# Patient Record
Sex: Female | Born: 1965 | Race: Black or African American | Hispanic: No | Marital: Married | Smoking: Never smoker
Health system: Southern US, Community
[De-identification: ages and names within clinical notes are randomized; demographics above are authoritative.]

## PROBLEM LIST (undated history)

## (undated) DIAGNOSIS — Z8669 Personal history of other diseases of the nervous system and sense organs: Secondary | ICD-10-CM

## (undated) DIAGNOSIS — G510 Bell's palsy: Secondary | ICD-10-CM

## (undated) DIAGNOSIS — G43019 Migraine without aura, intractable, without status migrainosus: Secondary | ICD-10-CM

## (undated) HISTORY — DX: Personal history of other diseases of the nervous system and sense organs: Z86.69

---
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
## 2000-06-28 ENCOUNTER — Encounter: Payer: Self-pay | Admitting: Infectious Diseases

## 2000-06-28 ENCOUNTER — Ambulatory Visit (HOSPITAL_COMMUNITY): Admission: RE | Admit: 2000-06-28 | Discharge: 2000-06-28 | Payer: Self-pay | Admitting: Infectious Diseases

## 2000-07-08 ENCOUNTER — Other Ambulatory Visit: Admission: RE | Admit: 2000-07-08 | Discharge: 2000-07-08 | Payer: Self-pay | Admitting: Family Medicine

## 2001-01-10 ENCOUNTER — Inpatient Hospital Stay (HOSPITAL_COMMUNITY): Admission: AD | Admit: 2001-01-10 | Discharge: 2001-01-10 | Payer: Self-pay | Admitting: Obstetrics and Gynecology

## 2001-06-20 ENCOUNTER — Ambulatory Visit (HOSPITAL_COMMUNITY): Admission: RE | Admit: 2001-06-20 | Discharge: 2001-06-20 | Payer: Self-pay | Admitting: Obstetrics and Gynecology

## 2001-06-20 ENCOUNTER — Encounter: Payer: Self-pay | Admitting: Obstetrics and Gynecology

## 2002-02-14 ENCOUNTER — Ambulatory Visit (HOSPITAL_COMMUNITY): Admission: RE | Admit: 2002-02-14 | Discharge: 2002-02-14 | Payer: Self-pay | Admitting: *Deleted

## 2002-05-20 ENCOUNTER — Other Ambulatory Visit: Admission: RE | Admit: 2002-05-20 | Discharge: 2002-05-20 | Payer: Self-pay | Admitting: Obstetrics and Gynecology

## 2002-07-01 ENCOUNTER — Ambulatory Visit (HOSPITAL_COMMUNITY): Admission: RE | Admit: 2002-07-01 | Discharge: 2002-07-01 | Payer: Self-pay | Admitting: Obstetrics and Gynecology

## 2002-07-14 ENCOUNTER — Encounter: Payer: Self-pay | Admitting: Obstetrics and Gynecology

## 2002-07-14 ENCOUNTER — Ambulatory Visit (HOSPITAL_COMMUNITY): Admission: RE | Admit: 2002-07-14 | Discharge: 2002-07-14 | Payer: Self-pay | Admitting: Obstetrics and Gynecology

## 2002-12-13 ENCOUNTER — Inpatient Hospital Stay (HOSPITAL_COMMUNITY): Admission: AD | Admit: 2002-12-13 | Discharge: 2002-12-17 | Payer: Self-pay | Admitting: Obstetrics and Gynecology

## 2002-12-18 ENCOUNTER — Encounter: Admission: RE | Admit: 2002-12-18 | Discharge: 2003-01-17 | Payer: Self-pay | Admitting: Obstetrics and Gynecology

## 2003-02-17 ENCOUNTER — Encounter: Admission: RE | Admit: 2003-02-17 | Discharge: 2003-03-19 | Payer: Self-pay | Admitting: Obstetrics and Gynecology

## 2003-03-20 ENCOUNTER — Encounter: Admission: RE | Admit: 2003-03-20 | Discharge: 2003-04-19 | Payer: Self-pay | Admitting: Obstetrics and Gynecology

## 2003-05-20 ENCOUNTER — Encounter: Admission: RE | Admit: 2003-05-20 | Discharge: 2003-06-19 | Payer: Self-pay | Admitting: Internal Medicine

## 2003-11-04 ENCOUNTER — Other Ambulatory Visit: Admission: RE | Admit: 2003-11-04 | Discharge: 2003-11-04 | Payer: Self-pay | Admitting: Family Medicine

## 2005-09-04 ENCOUNTER — Ambulatory Visit: Payer: Self-pay | Admitting: Gastroenterology

## 2005-09-13 ENCOUNTER — Other Ambulatory Visit: Admission: RE | Admit: 2005-09-13 | Discharge: 2005-09-13 | Payer: Self-pay | Admitting: Obstetrics and Gynecology

## 2005-09-20 ENCOUNTER — Encounter (INDEPENDENT_AMBULATORY_CARE_PROVIDER_SITE_OTHER): Payer: Self-pay | Admitting: Specialist

## 2005-09-20 ENCOUNTER — Ambulatory Visit: Payer: Self-pay | Admitting: Gastroenterology

## 2006-02-01 ENCOUNTER — Ambulatory Visit (HOSPITAL_COMMUNITY): Admission: RE | Admit: 2006-02-01 | Discharge: 2006-02-01 | Payer: Self-pay | Admitting: Obstetrics and Gynecology

## 2006-10-23 ENCOUNTER — Encounter: Admission: RE | Admit: 2006-10-23 | Discharge: 2006-10-23 | Payer: Self-pay | Admitting: Gastroenterology

## 2006-11-08 ENCOUNTER — Encounter: Admission: RE | Admit: 2006-11-08 | Discharge: 2006-11-08 | Payer: Self-pay | Admitting: Gastroenterology

## 2008-02-18 ENCOUNTER — Ambulatory Visit (HOSPITAL_COMMUNITY): Admission: RE | Admit: 2008-02-18 | Discharge: 2008-02-18 | Payer: Self-pay | Admitting: Gastroenterology

## 2009-05-24 ENCOUNTER — Ambulatory Visit (HOSPITAL_COMMUNITY): Admission: RE | Admit: 2009-05-24 | Discharge: 2009-05-24 | Payer: Self-pay | Admitting: Gastroenterology

## 2010-08-06 ENCOUNTER — Encounter: Payer: Self-pay | Admitting: Gastroenterology

## 2010-09-27 ENCOUNTER — Encounter: Payer: Self-pay | Admitting: Gastroenterology

## 2010-10-03 NOTE — Letter (Signed)
Summary: Colonoscopy Letter  [facility]  [address] [place], [place] [address]   Phone: [phone]  Fax: [phone]      September 27, 2010 MRN: [id number]   [facility] [address] [place], [place]  [address]   Dear Ms. HAYFORD,   According to your medical record, it is time for you to schedule a Colonoscopy. The American Cancer Society recommends this procedure as a method to detect early colon cancer. Patients with a family history of colon cancer, or a personal history of colon polyps or inflammatory bowel disease are at increased risk.  This letter has been generated based on the recommendations made at the time of your procedure. If you feel that in your particular situation this may no longer apply, please contact our office.  Please call our office at [phone] to schedule this appointment or to update your records at your earliest convenience.  Thank you for cooperating with [place] to provide you with the very best care possible.   Sincerely,  Judie Petit T. Russella Dar, M.D.  [facility] [phone]

## 2010-12-01 NOTE — Discharge Summary (Signed)
NAME:  Jennifer Jarvis, Jennifer Jarvis                        ACCOUNT NO.:  [id number]   MEDICAL RECORD NO.:  [id number]                   PATIENT TYPE:  INP   LOCATION:  9105                                 FACILITY:  [facility]   PHYSICIAN:  Huel Cote, M.D.              DATE OF BIRTH:  06-Oct-1965   DATE OF ADMISSION:  12/13/2002  DATE OF DISCHARGE:  12/17/2002                                 DISCHARGE SUMMARY   DISCHARGE DIAGNOSES:  1. Term pregnancy at 40+ weeks delivered.  2. Status post cerclage for possible incompetent cervix removed at 36 weeks.  3. Fibroid uterus.  4. Advanced maternal age.  5. Arrest of dilation.  6. Nonreassuring fetal tracing.  7. Status post low transverse cesarean section.   DISCHARGE MEDICATIONS:  1. Motrin 600 mg p.o. q.6h.  2. Percocet one to two tablets p.o. q.4h. p.r.n.   HISTORY OF PRESENT ILLNESS:  The patient is a 45 year old G3, P0-0-2-0 who  was admitted at 40+ weeks with an estimated day of delivery of Dec 12, 2002  with a complaint of ruptured membranes. She was admitted and having some  spontaneous contractions, therefore, was initially observed, however, was  begun on Pitocin when she failed to have significant cervical change.  Prenatal care had been complicated by history of cerclage placed at  approximately 15 weeks given possible history of incompetent cervix with a  16-week miscarriage in 1999.  This cerclage was removed at 36 weeks without  difficulty and the patient continued to carry until 40 weeks.  Prenatal care  was also complicated by advanced maternal age, however, the patient declined  amniocentesis.  She had a normal ultrasound.   PRENATAL LABORATORY DATA:  B+, antibody negative, sickle cell negative, RPR  nonreactive, rubella immune, hepatitis B surface antigen negative, HIV  negative, GC negative, Chlamydia negative, triple screen declined.  One hour  Glucola was 79, three hour was normal at 94, 179, 146, 126.  Group B strep  was  negative.   PAST MEDICAL HISTORY:  None.   PAST SURGICAL HISTORY:  None.   PAST GYNECOLOGICAL HISTORY:  No abnormal Pap smears.   PAST OBSTETRICAL HISTORY:  In 1999, she had a miscarriage at 16 weeks and in  2002 she had a miscarriage at four weeks.   PHYSICAL EXAMINATION:  VITAL SIGNS:  On admission, the patient was afebrile  with stable vital signs.  ABDOMEN:  Soft.  Fundal height was 39 cm.  Estimated fetal weight was 7-1/2  to 8 pounds.  VAGINAL:  Cervix was 2 cm, 50% effaced and -2 station.   HOSPITAL COURSE:  As stated, she was begun on Pitocin when her cervix did  not change substantially over the next several hours.  She had an IUPC  placed for adequate Pitocin adjustment after her epidural, given continued  slow progress from approximately 7:30 a.m. until 1:30 p.m., the patient  progressed from 3 cm to  5 cm; however, never became completely effaced and  fetal station was not progressing beyond a -1.  There were beginnings of  some late onset variables and a dysfunctional labor pattern.  The Pitocin  was adjusted several times and a fetal scalp electrode placed with  improvement of the strip temporarily, however, by 3:30 p.m. the patient  remained 80% effaced, 5 to 6 cm dilated and -1 station.  The vertex was in  the OP presentation and the fetal heart rate continued to have late onset  variables intermittently when the Pitocin was increased to adequate levels.  Given her lack of progress and the nonreassuring fetal tracing, the patient  and husband were counseled and agreed to proceed with a primary low  transverse cesarean section.  She was delivered of a vigorous female infant,  Apgars 8 and 9.  Weight was 10 pounds 7 ounces.  The uterus was noted to  have a pedunculated fibroid anteriorly.  Tubes and ovaries were normal  bilaterally.  She was then admitted for routine postoperative care and did  very well.   On postoperative day #3, she was tolerating a regular diet,  voiding without  difficulty, and her pain was well controlled on Motrin and Percocet.  Therefore, she was felt stable for discharge home.                                               Huel Cote, M.D.    KR/MEDQ  D:  12/17/2002  T:  12/17/2002  Job:  [id number]

## 2010-12-01 NOTE — Op Note (Signed)
   NAME:  Jennifer Jarvis, Jennifer Jarvis                        ACCOUNT NO.:  [id number]   MEDICAL RECORD NO.:  [id number]                   PATIENT TYPE:  AMB   LOCATION:  SDC                                  FACILITY:  [facility]   PHYSICIAN:  Zenaida Niece, M.D.             DATE OF BIRTH:  27-Apr-1966   DATE OF PROCEDURE:  07/01/2002  DATE OF DISCHARGE:                                 OPERATIVE REPORT   PREOPERATIVE DIAGNOSES:  Intrauterine pregnancy at 16 weeks and probable  incompetent cervix.   POSTOPERATIVE DIAGNOSES:  Intrauterine pregnancy at 16 weeks and probable  incompetent cervix.   PROCEDURE:  [employer] cervical cerclage.   SURGEON:  Zenaida Niece, M.D.   ANESTHESIA:  Spinal.   ESTIMATED BLOOD LOSS:  Less than 50 cc.   FINDINGS:  Preoperative cervix was closed, soft, and approximately 2.5 cm  long and it was unchanged after the suture was placed.   PROCEDURE IN DETAIL:  The patient was taken to the operating room and placed  in a sitting position.  Dr. Harvest Forest instilled spinal anesthesia and she was  placed in the dorsal supine position.  Her legs were then placed in mobile  stirrups as her spinal took effect.  The level of her anesthesia was found  to be adequate and her perineum was prepped and draped in the usual sterile  fashion and her bladder was drained with a red rubber catheter.  A weighted  speculum was inserted into the vagina and the vagina was irrigated with warm  saline.  A ring forceps was used to grasp the cervix anteriorly and it was  moved posteriorly when needed.  A [employer] cervical cerclage was performed  in a counterclockwise fashion with #1 Prolene without complications.  The  suture was tied anteriorly and the string was cut to approximately 2 cm.  The cervix and vagina were then again irrigated with warm saline and no  active bleeding was noted.  The cervix remained closed and a good 2 to 2.5  cm long.  All instruments were then removed from the  vagina.  The patient  was taken down from stirrups.  She was then taken to the recovery room in  stable condition after tolerating the procedure well.  Counts were correct  and she received no antibiotics.                                                Zenaida Niece, M.D.    TDM/MEDQ  D:  07/01/2002  T:  07/01/2002  Job:  [id number]

## 2010-12-01 NOTE — Op Note (Signed)
NAME:  Jennifer Jarvis, Jennifer Jarvis                        ACCOUNT NO.:  [id number]   MEDICAL RECORD NO.:  [id number]                   PATIENT TYPE:  INP   LOCATION:  9105                                 [facility]ILITY:  [facility]   PHYSICIAN:  Huel Cote, M.D.              DATE OF BIRTH:  09-May-1966   DATE OF PROCEDURE:  12/14/2002  DATE OF DISCHARGE:                                 OPERATIVE REPORT   PREOPERATIVE DIAGNOSES:  1. Term pregnancy at 40+ weeks.  2. Nonreassuring fetal tracing.  3. Arrest of dilation.  4. History of cerclage for incompetent cervix.   POSTOPERATIVE DIAGNOSES:  1. Term pregnancy at 40+ weeks.  2. Nonreassuring fetal tracing.  3. Arrest of dilation.  4. History of cerclage for incompetent cervix.   PROCEDURE:  Primary low transverse cesarean section via Pfannenstiel  incision.   SURGEON:  Huel Cote, M.D.   ANESTHESIA:  Epidural.   ESTIMATED BLOOD LOSS:  900 mL.   URINE OUTPUT:  250 mL clear urine.   INTRAVENOUS FLUIDS:  3000 mL LR.   FINDINGS:  The uterus was noted to have a pedunculated fundal fibroid  approximately 5 cm in width but tubes and ovaries were normal bilaterally.  There was vigorous female infant in the vertex presentation.  Apgars were 8  and 9.  Weight was 7 pounds 7 ounces, and placenta appeared essentially  normal except for some calcifications.   DESCRIPTION OF PROCEDURE:  The patient was taken to the operating room where  epidural anesthesia was found to be adequate by Allis clamp test.  She was  then prepped and draped in a normal sterile fashion in the dorsal supine  position with a leftward tilt.  A Pfannenstiel skin incision was then made  about 2 cm above the symphysis pubis and carried through to the underlying  layer of fascia with sharp dissection and Bovie cautery.  The fascia was  then nicked in the midline and the incision was extended laterally with Mayo  scissors.  The inferior aspect of the incision was then grasped  with Kocher  clamps, elevated and dissected off the underlying rectus muscles.  The  superior aspect was also grasped and elevated off the rectus muscles.  The  rectus muscles were separated in the midline.  The peritoneum was identified  and entered bluntly with a hemostat clamp.  At this point, there was some  serous fluid from peritoneal edema noted, and the remainder of the  peritoneal incision was extended both superiorly and inferiorly with careful  attention to avoid both bowel and bladder.  The bladder blade was then  inserted, and the vesicouterine peritoneum identified and the bladder flap  created with the Metzenbaum scissors. The bladder blade was then reinserted  under the flap, and the lower uterine incision was incised in a transverse  fashion with a scalpel.  This incision was extended with bandage scissors at  each  angle.  The infant's head was then delivered atraumatically, and the  remainder of the body delivered without difficulty.  The infant was bulb  suctioned.  The cord was clamped and cut, and the infant handed off to the  awaiting pediatricians. There was no nuchal cord noted, and the placenta  delivered manually with only some mild calcifications noted but no other  pathology evident.  The uterine incision was then grasped at each angle.  There was one small extension in the lower right angle which was also  grasped with a ring forceps.  The uterine incision was then repaired with 1-  0 chromic in a running locked fashion.  The extension was repaired  separately and found to be hemostatic.  An additional suture of 1-0 chromic  was placed in the left angle which was bleeding slightly at the conclusion  of the uterine closure, and this appeared hemostatic at that point.  The  abdomen and pelvis were carefully irrigated and inspected.  The uterus was  moved aside and the fibroid and the fundal surface anteriorly was palpated  which was approximately 5 cm and  pedunculated.  The tubes and ovaries were  normal bilaterally.  The bladder flap was carefully inspected as it had been  slightly adherent and there was no evidence of bladder injury.  One suture  of 3-0 Vicryl was used in a running stitch to close an area of peritoneum  over the bladder surface to provide reinforcement, however, there was no  breech of the lateral wall noted.  The pelvis and abdomen were once again  irrigated, and all found to be hemostatic.  Therefore, the instruments and  sponges were removed from the abdomen, and the rectus muscles were  reapproximated with two interrupted sutures of 1-0 chromic.  The subfascial  plane was inspected.  There was a mild area of bleeding noted anteriorly  which was controlled with Bovie cautery.  The fascia was then closed with 0  Vicryl in a running fashion.  The skin was closed with staples.  Sponge, lap  and needle counts were correct x2, and the patient was taken to the recovery  room in stable condition.                                               Huel Cote, M.D.    KR/MEDQ  D:  12/14/2002  T:  12/14/2002  Job:  [id number]

## 2011-07-23 DIAGNOSIS — J309 Allergic rhinitis, unspecified: Secondary | ICD-10-CM | POA: Insufficient documentation

## 2011-07-23 DIAGNOSIS — G4700 Insomnia, unspecified: Secondary | ICD-10-CM | POA: Insufficient documentation

## 2012-03-24 ENCOUNTER — Other Ambulatory Visit: Payer: Self-pay | Admitting: Family Medicine

## 2012-03-24 DIAGNOSIS — Z1231 Encounter for screening mammogram for malignant neoplasm of breast: Secondary | ICD-10-CM

## 2012-04-02 ENCOUNTER — Encounter: Payer: Self-pay | Admitting: Gastroenterology

## 2012-04-15 ENCOUNTER — Ambulatory Visit
Admission: RE | Admit: 2012-04-15 | Discharge: 2012-04-15 | Disposition: A | Discharge status: Home / Self Care | Payer: 59 | Source: Ambulatory Visit | Attending: Family Medicine | Admitting: Family Medicine

## 2012-04-15 DIAGNOSIS — Z1231 Encounter for screening mammogram for malignant neoplasm of breast: Secondary | ICD-10-CM

## 2014-11-05 ENCOUNTER — Emergency Department (HOSPITAL_COMMUNITY)
Admission: EM | Admit: 2014-11-05 | Discharge: 2014-11-05 | Disposition: A | Discharge status: Home / Self Care | Payer: 59 | Attending: Emergency Medicine | Admitting: Emergency Medicine

## 2014-11-05 ENCOUNTER — Emergency Department (HOSPITAL_COMMUNITY): Payer: 59

## 2014-11-05 ENCOUNTER — Encounter (HOSPITAL_COMMUNITY): Payer: Self-pay | Admitting: Emergency Medicine

## 2014-11-05 DIAGNOSIS — Y92410 Unspecified street and highway as the place of occurrence of the external cause: Secondary | ICD-10-CM | POA: Diagnosis not present

## 2014-11-05 DIAGNOSIS — Y998 Other external cause status: Secondary | ICD-10-CM | POA: Insufficient documentation

## 2014-11-05 DIAGNOSIS — S161XXA Strain of muscle, fascia and tendon at neck level, initial encounter: Secondary | ICD-10-CM | POA: Diagnosis not present

## 2014-11-05 DIAGNOSIS — Y9389 Activity, other specified: Secondary | ICD-10-CM | POA: Diagnosis not present

## 2014-11-05 DIAGNOSIS — S199XXA Unspecified injury of neck, initial encounter: Secondary | ICD-10-CM | POA: Diagnosis present

## 2014-11-05 DIAGNOSIS — Z3202 Encounter for pregnancy test, result negative: Secondary | ICD-10-CM | POA: Insufficient documentation

## 2014-11-05 DIAGNOSIS — S3991XA Unspecified injury of abdomen, initial encounter: Secondary | ICD-10-CM | POA: Diagnosis not present

## 2014-11-05 DIAGNOSIS — Z91040 Latex allergy status: Secondary | ICD-10-CM | POA: Diagnosis not present

## 2014-11-05 LAB — URINALYSIS, ROUTINE W REFLEX MICROSCOPIC
Bilirubin Urine: NEGATIVE
Glucose, UA: NEGATIVE mg/dL
HGB URINE DIPSTICK: NEGATIVE
Ketones, ur: NEGATIVE mg/dL
Leukocytes, UA: NEGATIVE
NITRITE: NEGATIVE
PROTEIN: NEGATIVE mg/dL
Specific Gravity, Urine: 1.005 (ref 1.005–1.030)
UROBILINOGEN UA: 0.2 mg/dL (ref 0.0–1.0)
pH: 5 (ref 5.0–8.0)

## 2014-11-05 LAB — POC URINE PREG, ED: PREG TEST UR: NEGATIVE
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
## 2014-11-05 MED ORDER — CYCLOBENZAPRINE HCL 10 MG PO TABS
10.0000 mg | ORAL_TABLET | Freq: Once | ORAL | Status: DC
  Filled 2014-11-05: qty 1

## 2014-11-05 MED ORDER — IBUPROFEN 800 MG PO TABS
800.0000 mg | ORAL_TABLET | Freq: Once | ORAL | Status: DC
  Filled 2014-11-05: qty 1

## 2014-11-05 MED ORDER — IBUPROFEN 800 MG PO TABS: 800.0000 mg | ORAL_TABLET | Freq: Three times a day (TID) | ORAL | Status: DC

## 2014-11-05 MED ORDER — ORPHENADRINE CITRATE ER 100 MG PO TB12: 100.0000 mg | ORAL_TABLET | Freq: Two times a day (BID) | ORAL | Status: DC
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
## 2014-11-05 NOTE — Discharge Instructions (Signed)
Cervical Sprain °A cervical sprain is an injury in the neck in which the strong, fibrous tissues (ligaments) that connect your neck bones stretch or tear. Cervical sprains can range from mild to severe. Severe cervical sprains can cause the neck vertebrae to be unstable. This can lead to damage of the spinal cord and can result in serious nervous system problems. The amount of time it takes for a cervical sprain to get better depends on the cause and extent of the injury. Most cervical sprains heal in 1 to 3 weeks. °CAUSES  °Severe cervical sprains may be caused by:  °· Contact sport injuries (such as from football, rugby, wrestling, hockey, auto racing, gymnastics, diving, martial arts, or boxing).   °· Motor vehicle collisions.   °· Whiplash injuries. This is an injury from a sudden forward and backward whipping movement of the head and neck.  °· Falls.   °Mild cervical sprains may be caused by:  °· Being in an awkward position, such as while cradling a telephone between your ear and shoulder.   °· Sitting in a chair that does not offer proper support.   °· Working at a poorly designed computer station.   °· Looking up or down for long periods of time.   °SYMPTOMS  °· Pain, soreness, stiffness, or a burning sensation in the front, back, or sides of the neck. This discomfort may develop immediately after the injury or slowly, 24 hours or more after the injury.   °· Pain or tenderness directly in the middle of the back of the neck.   °· Shoulder or upper back pain.   °· Limited ability to move the neck.   °· Headache.   °· Dizziness.   °· Weakness, numbness, or tingling in the hands or arms.   °· Muscle spasms.   °· Difficulty swallowing or chewing.   °· Tenderness and swelling of the neck.   °DIAGNOSIS  °Most of the time your health care provider can diagnose a cervical sprain by taking your history and doing a physical exam. Your health care provider will ask about previous neck injuries and any known neck  problems, such as arthritis in the neck. X-rays may be taken to find out if there are any other problems, such as with the bones of the neck. Other tests, such as a CT scan or MRI, may also be needed.  °TREATMENT  °Treatment depends on the severity of the cervical sprain. Mild sprains can be treated with rest, keeping the neck in place (immobilization), and pain medicines. Severe cervical sprains are immediately immobilized. Further treatment is done to help with pain, muscle spasms, and other symptoms and may include: °· Medicines, such as pain relievers, numbing medicines, or muscle relaxants.   °· Physical therapy. This may involve stretching exercises, strengthening exercises, and posture training. Exercises and improved posture can help stabilize the neck, strengthen muscles, and help stop symptoms from returning.   °HOME CARE INSTRUCTIONS  °· Put ice on the injured area.   °¨ Put ice in a plastic bag.   °¨ Place a towel between your skin and the bag.   °¨ Leave the ice on for 15-20 minutes, 3-4 times a day.   °· If your injury was severe, you may have been given a cervical collar to wear. A cervical collar is a two-piece collar designed to keep your neck from moving while it heals. °¨ Do not remove the collar unless instructed by your health care provider. °¨ If you have long hair, keep it outside of the collar. °¨ Ask your health care provider before making any adjustments to your collar. Minor   adjustments may be required over time to improve comfort and reduce pressure on your chin or on the back of your head. °¨ If you are allowed to remove the collar for cleaning or bathing, follow your health care provider's instructions on how to do so safely. °¨ Keep your collar clean by wiping it with mild soap and water and drying it completely. If the collar you have been given includes removable pads, remove them every 1-2 days and hand wash them with soap and water. Allow them to air dry. They should be completely  dry before you wear them in the collar. °¨ If you are allowed to remove the collar for cleaning and bathing, wash and dry the skin of your neck. Check your skin for irritation or sores. If you see any, tell your health care provider. °¨ Do not drive while wearing the collar.   °· Only take over-the-counter or prescription medicines for pain, discomfort, or fever as directed by your health care provider.   °· Keep all follow-up appointments as directed by your health care provider.   °· Keep all physical therapy appointments as directed by your health care provider.   °· Make any needed adjustments to your workstation to promote good posture.   °· Avoid positions and activities that make your symptoms worse.   °· Warm up and stretch before being active to help prevent problems.   °SEEK MEDICAL CARE IF:  °· Your pain is not controlled with medicine.   °· You are unable to decrease your pain medicine over time as planned.   °· Your activity level is not improving as expected.   °SEEK IMMEDIATE MEDICAL CARE IF:  °· You develop any bleeding. °· You develop stomach upset. °· You have signs of an allergic reaction to your medicine.   °· Your symptoms get worse.   °· You develop new, unexplained symptoms.   °· You have numbness, tingling, weakness, or paralysis in any part of your body.   °MAKE SURE YOU:  °· Understand these instructions. °· Will watch your condition. °· Will get help right away if you are not doing well or get worse. °Document Released: 04/29/2007 Document Revised: 07/07/2013 Document Reviewed: 01/07/2013 °ExitCare® Patient Information ©2015 [employer]. This information is not intended to replace advice given to you by your health care provider. Make sure you discuss any questions you have with your health care provider. ° ° °Emergency Department Resource Guide °1) Find a Doctor and Pay Out of Pocket °Although you won't have to find out who is covered by your insurance plan, it is a good idea to ask  around and get recommendations. You will then need to call the office and see if the doctor you have chosen will accept you as a new patient and what types of options they offer for patients who are self-pay. Some doctors offer discounts or will set up payment plans for their patients who do not have insurance, but you will need to ask so you aren't surprised when you get to your appointment. ° °2) Contact Your Local Health Department °Not all health departments have doctors that can see patients for sick visits, but many do, so it is worth a call to see if yours does. If you don't know where your local health department is, you can check in your phone book. The CDC also has a tool to help you locate your state's health department, and many state websites also have listings of all of their local health departments. ° °3) Find a Walk-in Clinic °If your illness is not   likely to be very severe or complicated, you may want to try a walk in clinic. These are popping up all over the country in pharmacies, drugstores, and shopping centers. They're usually staffed by nurse practitioners or physician assistants that have been trained to treat common illnesses and complaints. They're usually fairly quick and inexpensive. However, if you have serious medical issues or chronic medical problems, these are probably not your best option. ° °No Primary Care Doctor: °- Call [facility] at  832-8000 - they can help you locate a primary care doctor that  accepts your insurance, provides certain services, etc. °- Physician Referral Service- 1-800-533-3463 ° °Chronic Pain Problems: °Organization         Address  Phone   Notes  °[facility]  (336) 297-2271 Patients need to be referred by their primary care doctor.  ° °Medication Assistance: °Organization         Address  Phone   Notes  °[place] [facility] [address] °[place], [place] [address] (336) 641-8030 --Must be a  resident of [place] °-- Must have NO insurance coverage whatsoever (no Medicaid/ Medicare, etc.) °-- The pt. MUST have a primary care doctor that directs their care regularly and follows them in the community °  °[facility]  (866) 331-1348   °[employer]  (888) 892-1162   ° °Agencies that provide inexpensive medical care: °Organization         Address  Phone   Notes  °[facility]  (336) 832-8035   °[facility]    (336) 832-7272   °[facility] [address] °[place], [place] [address] (336) 832-4777   °[facility] [address], °[place] (336) 271-4999   °[facility]    (336) 373-0678   °[facility]    (336) 272-1050   °[facility] ° [address], [place] Phone:  (336) 832-4444, Fax:  (336) 832-4440 Hours of Operation:  9 am - 6 pm, M-F.  Also accepts Medicaid/Medicare and self-pay.  °[facility] ° [address], [place] Phone: (336) 832-3150, Fax: (336) 832-3151. Hours of Operation:  8:30 am - 5:30 pm, M-F.  Also accepts Medicaid and self-pay.  °[facility] [place] [address], [place] Phone: (336) 878-6027   °[facility] [address], [place], [place] (336)723-1848, Ext. 123 Mondays & Thursdays: 7-9 AM.  First 15 patients are seen on a first come, first serve basis. °  ° °Medicaid-accepting [place] Providers: ° °Organization         Address  Phone   Notes  °[facility] [address], [place] (336) 641-2100 Also accepts self-pay patients.  °[facility] [address], [place] ° (336) 856-9996   °[facility] [address], [place] (336) 288-8857   °[facility] [address] [place] [address], [place] (336) 299-7000   °Veita Bland [address], [place]  ° (336) 373-1557 Only accepts  [employer] Access Medicaid patients after they have their name applied to their card.  ° °Self-Pay (no insurance) in [place]: ° °Organization         Address  Phone   Notes  °Sickle Cell Patients, [facility] [address], [place] (336) 832-1970   °[facility] [address], [place] (336) 832-4400   °[facility] [place] ° [address], [place] (336) 992-4800   °[facility]/Dr. Osei-Bonsu ° [address] [place] [address], [place] or [address], [place] (336) 841-8500 Phone number for both [place] and [place] locations is the same.  °[facility] [address], [place] (336) 299-0000   °[facility] [address] [place] [address], [place] or [address] (336) 852-7530 °(336) 878-2260   °[facility] [address], [place] (336) 350-1642, phone; (336) 294-5005, fax Sees patients 1st and 3rd Saturday of every month.  Must not qualify for public or private insurance (i.e. Medicaid, Medicare, [place] Health Choice, Veterans' Benefits) • Household income should be no more than 200% of the poverty level •The clinic cannot treat you if you are pregnant or think you are pregnant • Sexually transmitted diseases are not treated at the clinic.  ° ° °Dental Care: °Organization         Address  Phone  Notes  °[facility] [address], [place] (336) 641-6152 Accepts children up to age 21 who are enrolled in Medicaid or [place] Health Choice; pregnant women with a Medicaid card; and children who have applied for Medicaid or [place] Health Choice, but were declined, whose parents can pay a reduced fee at time of service.  °[facility] [place]  [address], [place] (336) 641-7733 Accepts children up to age 21 who are enrolled in Medicaid or [place] Health Choice; pregnant women  with a Medicaid card; and children who have applied for Medicaid or [place] Health Choice, but were declined, whose parents can pay a reduced fee at time of service.  °[facility] ° [address], [place] (336) 641-4533 Patients are seen by appointment only. Walk-ins are not accepted. [facility] will see patients 18 years of age and older. °Monday - Tuesday (8am-5pm) °Most Wednesdays (8:30-5pm) °$30 per visit, cash only  °[facility] ° [address], [place] (336) 641-4533 Patients are seen by appointment only. Walk-ins are not accepted. [facility] will see patients 18 years of age and older. °One Wednesday Evening (Monthly: Volunteer Based).  $30 per visit, cash only  °[facility]  (919) 537-3737 for adults; Children under age 4, call Graduate Pediatric Dentistry at ([phone]) [phone]. Children aged 4-14, please call (919) [phone] to request a pediatric application. ° Dental services are provided in all areas of dental care including fillings, crowns and bridges, complete and partial dentures, implants, gum treatment, root canals, and extractions. Preventive care is also provided. Treatment is provided to both adults and children. °Patients are selected via a lottery and there is often a waiting list. °  °[facility] [address], °[place] ° (336) 763-8833 [email or web address] °  °[facility] [address], [place], [place] (336)723-1848, Ext. 123 Second and Fourth Thursday of each month, opens at 6:30 AM; Clinic ends at 9 AM.  Patients are seen on a first-come first-served basis, and a limited number are seen during each clinic.  ° °[facility] ° [address], [place], [place] (336) 723-7904   Eligibility Requirements °You must have lived in [place], [place], or [place] counties for at least the last three months. °    You cannot be eligible for state or federal sponsored healthcare  insurance, including Veterans Administration, Medicaid, or Medicare. °  You generally cannot be eligible for healthcare insurance through your employer.  °  How to apply: °Eligibility screenings are held every Tuesday and Wednesday afternoon from 1:00 pm until 4:00 pm. You do not need an appointment for the interview!  °[facility] [address], [place], [place] 336-631-2330   °[facility]  336-342-8273   °[facility]  336-703-3100   °[facility]  336-570-6415   ° °Behavioral Health Resources in the Community: °Intensive Outpatient Programs °Organization         Address  Phone  Notes  °[facility] [address], [place], [place] 336-878-6098   °[facility] [address], [place], [place] 336-832-9800   °[facility] [address], [place], [place] ° 336-882-2125   °[facility] [address],  °[place], [place] 1-800-853-5163 or 336-641-4981   °Substance Abuse Resources °Organization         Address  Phone  Notes  °[facility]  336-882-2125   °[facility]  336-784-9470   °[facility]  336-285-9073   °[facility]  336-845-3988   °Residential & Outpatient Substance Abuse Program  1-800-659-3381   °Psychological Services °Organization         Address  Phone  Notes  °[facility]  336- 832-9600   °[facility]  336- 378-7881   °[facility] [address], [place] 1-800-853-5163 or 336-641-4981   ° °Mobile Crisis Teams °Organization         Address  Phone  Notes  °[facility], Mobile Crisis Care Unit  1-877-626-1772   °Assertive °[facility] ° [address] [place], [place] 336-834-9664   °Sharon DeEsch [address] °[place] 336-554-5454   ° °Self-Help/Support Groups °Organization         Address  Phone             Notes  °[facility] - variety of support groups  336- 373-1402 Call for more information  °Narcotics Anonymous (NA), [facility] [address], °[place]  2 meetings at this location  ° °Residential Treatment Programs °Organization         Address  Phone  Notes  °[facility] [address],    °[place]  1-866-801-8205   °[facility] ° [address], [place], [place] 704-293-8524   °[facility] [address], [place] 336-845-3988 Admissions: 8am-3pm M-F  °[facility] [address]    °[place], [place] 336-841-1104   °[facility] [address], [place], [place] 336-379-7146   °[facility] [address]  °[place], [place] 336-285-9073   °[facility] - Intensive Outpatient [address], , [place] 336-852-3033   °[facility] [address]  °[place], [place] 1-877-615-2722 or 336-784-9470   °[facility] [address] [place],  336-227-7417 Accepts Medicaid  °[facility] [address]  °  1-800-659-3381 Substance Abuse/Addiction Treatment  ° °[place] Behavioral Health Resources °Organization         Address  Phone  Notes  °[facility]  (888) 581-9988   °Julie Brannon, PhD [address] [place], [place]   (336) 349-5553 or (336) 951-0000   °[facility]   [address] °[place], [place] (336) 349-4454   °[facility] [address], [place], [place] (336) 342-8316 Insurance/Medicaid/sponsorship through [facility]  °[facility] [address]                                    [place], [place] (336) 342-8316 Therapy/tele-psych/case  °[facility] [address]  ° [place], [place] (336) 349-2233    °Dr. Arfeen  (336) 349-4544   °[facility] [place]  [employer] [place] [facility] 1) [address],   °2) [address], [place] °3)  [address], [place] (336) 349-3220 °(336) 342-7768 ° °(336) 342-8140   °[place] [facility] (336) 342-1394 or (336) 342-3537 (After Hours)    ° ° ° °

## 2014-11-05 NOTE — ED Notes (Signed)
Patient transported to X-ray 
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
## 2016-02-06 DIAGNOSIS — R7303 Prediabetes: Secondary | ICD-10-CM | POA: Diagnosis not present

## 2016-02-06 DIAGNOSIS — R202 Paresthesia of skin: Secondary | ICD-10-CM | POA: Insufficient documentation

## 2016-02-06 DIAGNOSIS — R200 Anesthesia of skin: Secondary | ICD-10-CM | POA: Diagnosis not present

## 2016-02-06 DIAGNOSIS — M542 Cervicalgia: Secondary | ICD-10-CM | POA: Diagnosis not present

## 2016-02-06 DIAGNOSIS — M549 Dorsalgia, unspecified: Secondary | ICD-10-CM | POA: Diagnosis not present

## 2016-02-06 DIAGNOSIS — D649 Anemia, unspecified: Secondary | ICD-10-CM | POA: Diagnosis not present

## 2016-02-07 DIAGNOSIS — M546 Pain in thoracic spine: Secondary | ICD-10-CM | POA: Diagnosis not present

## 2016-02-07 DIAGNOSIS — M545 Low back pain: Secondary | ICD-10-CM | POA: Diagnosis not present

## 2016-02-07 DIAGNOSIS — M50322 Other cervical disc degeneration at C5-C6 level: Secondary | ICD-10-CM | POA: Diagnosis not present

## 2016-03-05 DIAGNOSIS — M545 Low back pain: Secondary | ICD-10-CM | POA: Diagnosis not present

## 2016-03-05 DIAGNOSIS — M542 Cervicalgia: Secondary | ICD-10-CM | POA: Diagnosis not present

## 2016-03-12 DIAGNOSIS — R202 Paresthesia of skin: Secondary | ICD-10-CM | POA: Diagnosis not present

## 2016-03-12 DIAGNOSIS — M542 Cervicalgia: Secondary | ICD-10-CM | POA: Diagnosis not present

## 2016-03-12 DIAGNOSIS — D649 Anemia, unspecified: Secondary | ICD-10-CM | POA: Diagnosis not present

## 2016-03-12 DIAGNOSIS — Z0000 Encounter for general adult medical examination without abnormal findings: Secondary | ICD-10-CM | POA: Diagnosis not present

## 2016-04-17 ENCOUNTER — Encounter: Payer: Self-pay | Admitting: Neurology

## 2016-04-17 ENCOUNTER — Ambulatory Visit (INDEPENDENT_AMBULATORY_CARE_PROVIDER_SITE_OTHER): Payer: 59 | Admitting: Neurology

## 2016-04-17 VITALS — BP 117/82 | HR 76 | Ht 63.0 in | Wt 200.0 lb

## 2016-04-17 DIAGNOSIS — G894 Chronic pain syndrome: Secondary | ICD-10-CM | POA: Diagnosis not present

## 2016-04-17 DIAGNOSIS — R202 Paresthesia of skin: Secondary | ICD-10-CM

## 2016-04-17 NOTE — Patient Instructions (Signed)
   We will check blood work today and get EMG and NCV evaluation on the nerves and muscles.

## 2016-04-17 NOTE — Progress Notes (Signed)
Reason for visit: Chronic pain  Referring physician: Dr. Sherrill RaringBriscoe  Jennifer Jarvis is a 50 y.o. female  History of present illness:  Jennifer Jarvis is a 50 year old right-handed black female with a history of chronic low back pain and neck discomfort dating back 5-6 years. Over the last 18 months, the discomfort has gradually worsened over time also paralleled by increasing problems with insomnia and chronic fatigue. The patient has decreased cognitive functioning secondary to the lack of sleep. The patient works as a Engineer, civil (consulting)nurse, she reports that the pain may worsen throughout the working day. The patient has a lot of pain in the neck, shoulders bilaterally, with pain going up the back of the head into the occipital area. She reports some neck stiffness and occasional muscle spasms. She also has chronic low back pain and pain down the left leg all the way to the foot. She reports paresthesias in the hands and down into the left leg. The patient feels that the left arm and left leg are slightly weak. The patient denies any balance issues, she does have some occasional stress incontinence of the bladder, she denies any issues controlling the bowels. She may take ibuprofen or Tylenol for the discomfort, she recently was placed on Mobic. She has undergone some physical therapy, she tries to do some stretching exercises off and on. She denies any systemic symptoms such as weight loss, night sweats, fevers or chills. She has been noted in the past have mildly elevated CK enzyme levels. She is sent to this office for an evaluation. A vitamin B12 level was unremarkable.  History reviewed. No pertinent past medical history.  Past Surgical History:  Procedure Laterality Date  . CESAREAN SECTION      History reviewed. No pertinent family history.  Social history:  reports that she has never smoked. She has never used smokeless tobacco. She reports that she does not drink alcohol or use drugs.  Medications:    Prior to Admission medications   Medication Sig Start Date End Date Taking? Authorizing Provider  docusate sodium (COLACE) 100 MG capsule Take 200 mg by mouth 2 (two) times daily as needed for mild constipation or moderate constipation.    Yes Historical Provider, MD  ibuprofen (ADVIL,MOTRIN) 200 MG tablet Take 400 mg by mouth every 6 (six) hours as needed for headache, mild pain or moderate pain.   Yes Historical Provider, MD  meloxicam (MOBIC) 15 MG tablet Take by mouth. 04/11/16 04/11/17 Yes Historical Provider, MD  triamcinolone (NASACORT) 55 MCG/ACT AERO nasal inhaler Place 2 sprays into the nose daily as needed.   Yes Historical Provider, MD  zolpidem (AMBIEN) 10 MG tablet Take 5 mg by mouth at bedtime as needed for sleep.  05/04/14  Yes Historical Provider, MD  acetaminophen (TYLENOL) 500 MG tablet Take 1,000 mg by mouth every 6 (six) hours as needed for mild pain, moderate pain or headache.    Historical Provider, MD      Allergies  Allergen Reactions  . Latex Rash  . Eggs Or Egg-Derived Products Hives    ROS:  Out of a complete 14 system review of symptoms, the patient complains only of the following symptoms, and all other reviewed systems are negative.  Blurred vision Achy muscles Headache, numbness, dizziness Insomnia, sleepiness, shift work  Blood pressure 117/82, pulse 76, height 5\' 3"  (1.6 m), weight 200 lb (90.7 kg).  Physical Exam  General: The patient is alert and cooperative at the time of the examination.  Eyes: Pupils are equal, round, and reactive to light. Discs are flat bilaterally.  Neck: The neck is supple, no carotid bruits are noted.  Respiratory: The respiratory examination is clear.  Cardiovascular: The cardiovascular examination reveals a regular rate and rhythm, no obvious murmurs or rubs are noted.  Neuromuscular: The patient lacks about 30 of full lateral rotation of the cervical spine bilaterally. The patient has full range of motion of the  low back.  Skin: Extremities are without significant edema.  Neurologic Exam  Mental status: The patient is alert and oriented x 3 at the time of the examination. The patient has apparent normal recent and remote memory, with an apparently normal attention span and concentration ability.  Cranial nerves: Facial symmetry is present. There is good sensation of the face to pinprick and soft touch bilaterally. The strength of the facial muscles and the muscles to head turning and shoulder shrug are normal bilaterally. Speech is well enunciated, no aphasia or dysarthria is noted. Extraocular movements are full. Visual fields are full. The tongue is midline, and the patient has symmetric elevation of the soft palate. No obvious hearing deficits are noted.  Motor: The motor testing reveals 5 over 5 strength of all 4 extremities. Good symmetric motor tone is noted throughout.  Sensory: Sensory testing is intact to pinprick, soft touch, vibration sensation, and position sense on all 4 extremities. No evidence of extinction is noted.  Coordination: Cerebellar testing reveals good finger-nose-finger and heel-to-shin bilaterally.  Gait and station: Gait is normal. Tandem gait is normal. Romberg is negative. No drift is seen.  Reflexes: Deep tendon reflexes are symmetric and normal bilaterally. Toes are downgoing bilaterally.   Assessment/Plan:  1. Neuromuscular discomfort  2. Paresthesias, both hands, left leg  The patient may have a fibromyalgia syndrome. The patient will be sent for further blood work today, nerve conduction studies will be done on both arms and the left leg. EMG will be done on the left leg. She will follow-up for the above study. We may consider medications for fibromyalgia if the workup is unremarkable.  Marlan Palau MD 04/17/2016 9:52 AM  [facility] [address] [place], [place] [address]  Phone [phone] Fax [phone]

## 2016-04-18 ENCOUNTER — Telehealth: Payer: Self-pay | Admitting: Neurology

## 2016-04-18 LAB — ANGIOTENSIN CONVERTING ENZYME: Angio Convert Enzyme: 23 U/L (ref 14–82)

## 2016-04-18 LAB — CK: Total CK: 185 U/L — ABNORMAL HIGH (ref 24–173)

## 2016-04-18 LAB — ANA W/REFLEX: ANA: NEGATIVE

## 2016-04-18 LAB — B. BURGDORFI ANTIBODIES: Lyme IgG/IgM Ab: <0.91 {ISR} (ref 0.00–0.90)

## 2016-04-18 LAB — RHEUMATOID FACTOR

## 2016-04-18 LAB — SEDIMENTATION RATE: SED RATE: 15 mm/h (ref 0–32)

## 2016-04-18 NOTE — Telephone Encounter (Signed)
I called the patient. The blood work was OK except for a minimal elevation of the CK level, could be normal for the patient. EMG pending.

## 2016-05-08 ENCOUNTER — Telehealth: Payer: Self-pay | Admitting: Neurology

## 2016-05-08 NOTE — Telephone Encounter (Signed)
error 

## 2016-05-29 ENCOUNTER — Ambulatory Visit (INDEPENDENT_AMBULATORY_CARE_PROVIDER_SITE_OTHER): Payer: 59 | Admitting: Neurology

## 2016-05-29 ENCOUNTER — Encounter: Payer: Self-pay | Admitting: Neurology

## 2016-05-29 ENCOUNTER — Ambulatory Visit (INDEPENDENT_AMBULATORY_CARE_PROVIDER_SITE_OTHER): Payer: Self-pay | Admitting: Neurology

## 2016-05-29 DIAGNOSIS — G894 Chronic pain syndrome: Secondary | ICD-10-CM

## 2016-05-29 DIAGNOSIS — R202 Paresthesia of skin: Secondary | ICD-10-CM

## 2016-05-29 NOTE — Procedures (Signed)
     HISTORY:  Jennifer CarwinSophia Jarvis is a 50 year old patient with a history of neck, shoulder, back, and hip pain. The patient reports some discomfort down the left arm into the thumb and index finger. The patient currently has severe neck stiffness and left arm pain. She is being evaluated for the neuromuscular discomfort.  NERVE CONDUCTION STUDIES:  Nerve conduction studies were performed on the left upper extremity. The distal motor latencies and motor amplitudes for the median and ulnar nerves were within normal limits. The F wave latencies and nerve conduction velocities for these nerves were also normal. The sensory latencies for the median and ulnar nerves were normal.  Nerve conduction studies were performed on the left lower extremity. The distal motor latencies and motor amplitudes for the peroneal and posterior tibial nerves were within normal limits. The nerve conduction velocities for these nerves were also normal. The H reflex latency was normal. The sensory latency for the peroneal nerve was within normal limits.   EMG STUDIES:  EMG study was performed on the left upper extremity:  The first dorsal interosseous muscle reveals 2 to 4 K units with full recruitment. No fibrillations or positive waves were noted. The abductor pollicis brevis muscle reveals 2 to 4 K units with full recruitment. No fibrillations or positive waves were noted. The extensor indicis proprius muscle reveals 1 to 3 K units with full recruitment. No fibrillations or positive waves were noted. The pronator teres muscle reveals 2 to 3 K units with full recruitment. No fibrillations or positive waves were noted. The biceps muscle reveals 1 to 2 K units with full recruitment. No fibrillations or positive waves were noted. The triceps muscle reveals 2 to 4 K units with full recruitment. No fibrillations or positive waves were noted. The anterior deltoid muscle reveals 2 to 3 K units with full recruitment. No  fibrillations or positive waves were noted. The cervical paraspinal muscles were tested at 2 levels. No abnormalities of insertional activity were seen at either level tested. There was good relaxation.   IMPRESSION:  Nerve conduction studies done on the left upper and left lower extremities were within normal limits. There is no evidence of a peripheral neuropathy. EMG of the left upper extremity is unremarkable, without evidence of an overlying cervical radiculopathy.  Marlan Palau. Keith Saxton Chain MD 05/29/2016 4:44 PM  [facility] [address] [place], [place] [id number]  Phone [phone] Fax [phone]

## 2016-05-29 NOTE — Progress Notes (Signed)
The patient comes in for EMG and nerve conduction study today.  The study of the left arm and left leg with nerve conductions were normal, EMG of the left arm was normal.  I have recommended going on Cymbalta, and getting into physical therapy. The patient does not wish to consider this option at this time.  The patient will contact me if she desires any further treatment, we may consider MRI of the cervical spine at some point.  The patient will contact our office if she desires any further treatment or workup.

## 2016-05-29 NOTE — Progress Notes (Signed)
Please refer to EMG and nerve conduction study evaluation. 

## 2019-02-13 DIAGNOSIS — K5900 Constipation, unspecified: Secondary | ICD-10-CM | POA: Insufficient documentation

## 2019-02-13 DIAGNOSIS — M542 Cervicalgia: Secondary | ICD-10-CM | POA: Insufficient documentation

## 2019-02-13 DIAGNOSIS — G8929 Other chronic pain: Secondary | ICD-10-CM | POA: Insufficient documentation

## 2019-04-27 ENCOUNTER — Emergency Department (HOSPITAL_COMMUNITY): Payer: BC Managed Care – PPO

## 2019-04-27 ENCOUNTER — Other Ambulatory Visit: Payer: Self-pay

## 2019-04-27 ENCOUNTER — Emergency Department (HOSPITAL_COMMUNITY)
Admission: EM | Admit: 2019-04-27 | Discharge: 2019-04-27 | Disposition: A | Discharge status: Home / Self Care | Payer: BC Managed Care – PPO | Attending: Emergency Medicine | Admitting: Emergency Medicine

## 2019-04-27 DIAGNOSIS — R2990 Unspecified symptoms and signs involving the nervous system: Secondary | ICD-10-CM | POA: Diagnosis not present

## 2019-04-27 DIAGNOSIS — R29818 Other symptoms and signs involving the nervous system: Secondary | ICD-10-CM | POA: Diagnosis not present

## 2019-04-27 DIAGNOSIS — R29810 Facial weakness: Secondary | ICD-10-CM | POA: Diagnosis present

## 2019-04-27 DIAGNOSIS — Z20828 Contact with and (suspected) exposure to other viral communicable diseases: Secondary | ICD-10-CM | POA: Insufficient documentation

## 2019-04-27 DIAGNOSIS — R531 Weakness: Secondary | ICD-10-CM

## 2019-04-27 DIAGNOSIS — Z91040 Latex allergy status: Secondary | ICD-10-CM | POA: Insufficient documentation

## 2019-04-27 LAB — COMPREHENSIVE METABOLIC PANEL
ALT: 15 U/L (ref 0–44)
AST: 20 U/L (ref 15–41)
Albumin: 3.7 g/dL (ref 3.5–5.0)
Alkaline Phosphatase: 53 U/L (ref 38–126)
Anion gap: 11 (ref 5–15)
BUN: 9 mg/dL (ref 6–20)
CO2: 21 mmol/L — ABNORMAL LOW (ref 22–32)
Calcium: 9.1 mg/dL (ref 8.9–10.3)
Chloride: 105 mmol/L (ref 98–111)
Creatinine, Ser: 0.82 mg/dL (ref 0.44–1.00)
GFR calc Af Amer: >60 mL/min (ref >60)
GFR calc non Af Amer: >60 mL/min (ref >60)
Glucose, Bld: 140 mg/dL — ABNORMAL HIGH (ref 70–99)
Potassium: 3.5 mmol/L (ref 3.5–5.1)
Sodium: 137 mmol/L (ref 135–145)
Total Bilirubin: 0.5 mg/dL (ref 0.3–1.2)
Total Protein: 7.4 g/dL (ref 6.5–8.1)

## 2019-04-27 LAB — CBC
HCT: 39.2 % (ref 36.0–46.0)
Hemoglobin: 13.1 g/dL (ref 12.0–15.0)
MCH: 28.8 pg (ref 26.0–34.0)
MCHC: 33.4 g/dL (ref 30.0–36.0)
MCV: 86.2 fL (ref 80.0–100.0)
Platelets: 346 10*3/uL (ref 150–400)
RBC: 4.55 MIL/uL (ref 3.87–5.11)
RDW: 14.7 % (ref 11.5–15.5)
WBC: 4.8 10*3/uL (ref 4.0–10.5)
nRBC: 0 % (ref 0.0–0.2)

## 2019-04-27 LAB — URINALYSIS, ROUTINE W REFLEX MICROSCOPIC
Bilirubin Urine: NEGATIVE
Glucose, UA: NEGATIVE mg/dL
Hgb urine dipstick: NEGATIVE
Ketones, ur: NEGATIVE mg/dL
Leukocytes,Ua: NEGATIVE
Nitrite: NEGATIVE
Protein, ur: NEGATIVE mg/dL
Specific Gravity, Urine: 1.009 (ref 1.005–1.030)
pH: 5 (ref 5.0–8.0)

## 2019-04-27 LAB — RAPID URINE DRUG SCREEN, HOSP PERFORMED
Amphetamines: NOT DETECTED
Barbiturates: NOT DETECTED
Benzodiazepines: NOT DETECTED
Cocaine: NOT DETECTED
Opiates: NOT DETECTED
Tetrahydrocannabinol: NOT DETECTED

## 2019-04-27 LAB — DIFFERENTIAL
Abs Immature Granulocytes: 0.01 10*3/uL (ref 0.00–0.07)
Basophils Absolute: 0.1 10*3/uL (ref 0.0–0.1)
Basophils Relative: 1 %
Eosinophils Absolute: 0.1 10*3/uL (ref 0.0–0.5)
Eosinophils Relative: 3 %
Immature Granulocytes: 0 %
Lymphocytes Relative: 40 %
Lymphs Abs: 2 10*3/uL (ref 0.7–4.0)
Monocytes Absolute: 0.3 10*3/uL (ref 0.1–1.0)
Monocytes Relative: 7 %
Neutro Abs: 2.4 10*3/uL (ref 1.7–7.7)
Neutrophils Relative %: 49 %

## 2019-04-27 LAB — SARS CORONAVIRUS 2 BY RT PCR (HOSPITAL ORDER, PERFORMED IN ~~LOC~~ HOSPITAL LAB): SARS Coronavirus 2: NEGATIVE

## 2019-04-27 LAB — PROTIME-INR
INR: 1 (ref 0.8–1.2)
Prothrombin Time: 12.8 seconds (ref 11.4–15.2)

## 2019-04-27 LAB — ETHANOL: Alcohol, Ethyl (B): <10 mg/dL (ref <10)

## 2019-04-27 LAB — I-STAT BETA HCG BLOOD, ED (MC, WL, AP ONLY): I-stat hCG, quantitative: <5 m[IU]/mL (ref <5)

## 2019-04-27 LAB — APTT: aPTT: 29 seconds (ref 24–36)

## 2019-04-27 MED ORDER — KETOROLAC TROMETHAMINE 15 MG/ML IJ SOLN
15.0000 mg | Freq: Once | INTRAMUSCULAR | Status: AC
  Administered 2019-04-27: 15 mg via INTRAVENOUS
  Filled 2019-04-27: qty 1

## 2019-04-27 MED ORDER — DIPHENHYDRAMINE HCL 50 MG/ML IJ SOLN
50.0000 mg | Freq: Once | INTRAMUSCULAR | Status: AC
  Administered 2019-04-27: 50 mg via INTRAVENOUS

## 2019-04-27 MED ORDER — PROCHLORPERAZINE EDISYLATE 10 MG/2ML IJ SOLN
5.0000 mg | Freq: Once | INTRAMUSCULAR | Status: AC
  Administered 2019-04-27: 5 mg via INTRAVENOUS
  Filled 2019-04-27: qty 2

## 2019-04-27 MED ORDER — METOCLOPRAMIDE HCL 5 MG/ML IJ SOLN
10.0000 mg | Freq: Once | INTRAMUSCULAR | Status: AC
  Administered 2019-04-27: 14:00:00 10 mg via INTRAVENOUS
  Filled 2019-04-27: qty 2

## 2019-04-27 MED ORDER — SODIUM CHLORIDE 0.9% FLUSH
3.0000 mL | Freq: Once | INTRAVENOUS | Status: AC
Start: 2019-04-27 — End: 2019-04-27
  Administered 2019-04-27: 3 mL via INTRAVENOUS

## 2019-04-27 MED ORDER — DEXAMETHASONE SODIUM PHOSPHATE 10 MG/ML IJ SOLN
10.0000 mg | Freq: Once | INTRAMUSCULAR | Status: AC
  Administered 2019-04-27: 10 mg via INTRAVENOUS
  Filled 2019-04-27: qty 1

## 2019-04-27 MED ORDER — GADOBUTROL 1 MMOL/ML IV SOLN: 10.0000 mL | Freq: Once | INTRAVENOUS | Status: DC | PRN

## 2019-04-27 MED ORDER — DIPHENHYDRAMINE HCL 50 MG/ML IJ SOLN
INTRAMUSCULAR | Status: AC
  Filled 2019-04-27: qty 1

## 2019-04-27 NOTE — ED Notes (Signed)
Pt d/c home with husband. AVS given opportunity for questions provided and answered with teachback

## 2019-04-27 NOTE — ED Provider Notes (Signed)
[facility] EM[facility]ENC[facility] Provider Note   CSN: [id number] Arrival date & time: 04/27/19  1123  An emergency department physician performed an initial assessment on this suspected stroke patient at 1240.  History   Chief Complaint Chief Complaint  Patient presents with  . Stroke Symptoms    HPI Suttyn HAYFORD Delford FieldWRIGHT is a 53 y.o. female.     HPI  53 year old female presents today complaining of onset of right facial droop that began at 9:51 AM this morning.  She states over the past several days she has felt some twitching in her face.  Today, she felt like there was a drooping and abdominal area noted in the facial droop.  She has also noted some right hand weakness.  She denies any difficulty walking or speaking.  She has noted some headaches for the past couple weeks in December.  She denies any fever, chills, sinus congestion or pressure.  No past medical history on file.  Patient Active Problem List   Diagnosis Date Noted  . Paresthesia 04/17/2016  . Chronic pain syndrome 04/17/2016    Past Surgical History:  Procedure Laterality Date  . CESAREAN SECTION       OB History   No obstetric history on file.      Home Medications    Prior to Admission medications   Medication Sig Start Date End Date Taking? Authorizing Provider  acetaminophen (TYLENOL) 500 MG tablet Take 1,000 mg by mouth every 6 (six) hours as needed for mild pain, moderate pain or headache.   Yes [provider]  docusate sodium (COLACE) 100 MG capsule Take 200 mg by mouth 2 (two) times daily as needed for mild constipation or moderate constipation.    Yes [provider]  ibuprofen (ADVIL,MOTRIN) 200 MG tablet Take 400 mg by mouth every 6 (six) hours as needed for headache, mild pain or moderate pain.   Yes [provider]  triamcinolone (NASACORT) 55 MCG/ACT AERO nasal inhaler Place 2 sprays into the nose daily as needed.   Yes [provider]  zolpidem (AMBIEN) 10 MG tablet Take 5 mg by mouth at bedtime as needed for sleep.  05/04/14  Yes [provider]    Family History Family History  Problem Relation Age of Onset  . Healthy Mother   . Healthy Father   . Healthy Sister   . Healthy Brother   . Healthy Sister     Social History Social History   Tobacco Use  . Smoking status: Never Smoker  . Smokeless tobacco: Never Used  Substance Use Topics  . Alcohol use: No  . Drug use: No     Allergies   Latex and Eggs or egg-derived products   Review of Systems Review of Systems  All other systems reviewed and are negative.    Physical Exam Updated Vital Signs BP 120/76   Pulse 76   Temp 97.9 F (36.6 C) (Oral)   Resp 20   Ht 1.6 m (5\' 3" )   Wt 99.8 kg   LMP 03/28/2019   SpO2 100%   BMI 38.97 kg/m   Physical Exam Vitals signs and nursing note reviewed.  Constitutional:      Appearance: Normal appearance.  HENT:     Head: Normocephalic.     Right Ear: External ear normal.     Left Ear: External ear normal.     Nose: Nose normal.     Mouth/Throat:     Mouth: Mucous membranes  are moist.  Eyes:     Extraocular Movements: Extraocular movements intact.     Pupils: Pupils are equal, round, and reactive to light.  Neck:     Musculoskeletal: Normal range of motion.  Cardiovascular:     Rate and Rhythm: Normal rate and regular rhythm.  Pulmonary:     Effort: Pulmonary effort is normal.     Breath sounds: Normal breath sounds.  Abdominal:     General: Bowel sounds are normal. There is no distension.     Palpations: Abdomen is soft. There is no mass.     Tenderness: There is no abdominal tenderness.  Musculoskeletal: Normal range of motion.  Skin:    General: Skin is warm and dry.     Capillary Refill: Capillary refill takes less than 2 seconds.  Neurological:     Mental Status: She is alert and oriented to person, place, and time.     Cranial Nerves: Cranial nerve deficit  present.     Sensory: Sensory deficit present.     Comments: Right facial droop but does not cross midline forehead Patient has some right palmar drift Decreased sensation right side face and right arm  Psychiatric:        Behavior: Behavior normal.      ED Treatments / Results  Labs (all labs ordered are listed, but only abnormal results are displayed) Labs Reviewed  COMPREHENSIVE METABOLIC PANEL - Abnormal; Notable for the following components:      Result Value   CO2 21 (*)    Glucose, Bld 140 (*)    All other components within normal limits  SARS CORONAVIRUS 2 BY RT PCR (HOSPITAL ORDER, PERFORMED IN [facility])  PROTIME-INR  APTT  CBC  DIFFERENTIAL  ETHANOL  RAPID URINE DRUG SCREEN, HOSP PERFORMED  URINALYSIS, ROUTINE W REFLEX MICROSCOPIC  I-STAT CHEM 8, ED  CBG MONITORING, ED  I-STAT BETA HCG BLOOD, ED ([facility], [facility], [facility] ONLY)    EKG None  Radiology Ct Head Code Stroke Wo Contrast  Result Date: 04/27/2019 CLINICAL DATA:  Code stroke.  Right-sided weakness and facial droop EXAM: CT HEAD WITHOUT CONTRAST TECHNIQUE: Contiguous axial images were obtained from the base of the skull through the vertex without intravenous contrast. COMPARISON:  None. FINDINGS: Brain: Normal appearance without evidence of accelerated atrophy, old or acute infarction, mass lesion, hemorrhage, hydrocephalus or extra-axial collection. Vascular: No abnormal vascular finding. Skull: Normal Sinuses/Orbits: Clear/normal Other: None ASPECTS (Alberta Stroke Program Early CT Score) - Ganglionic level infarction (caudate, lentiform nuclei, internal capsule, insula, M1-M3 cortex): 7 - Supraganglionic infarction (M4-M6 cortex): 3 Total score (0-10 with 10 being normal): 10 IMPRESSION: 1. Normal head CT. 2. ASPECTS is 10. 3. These results were communicated to Dr. Laurence Slate at 1:00 pmon 10/12/2020by text page via the [facility] messaging system. Electronically Signed   By: Paulina Fusi M.D.   On: 04/27/2019 13:02     Procedures .Critical Care Performed by: Margarita Grizzle, MD Authorized by: Margarita Grizzle, MD   Critical care provider statement:    Critical care time (minutes):  45   Critical care end time:  04/27/2019 4:00 PM   Critical care was necessary to treat or prevent imminent or life-threatening deterioration of the following conditions:  CNS failure or compromise   Critical care was time spent personally by me on the following activities:  Discussions with consultants, evaluation of patient's response to treatment, examination of patient, ordering and performing treatments and interventions, ordering and review of laboratory studies,  ordering and review of radiographic studies, pulse oximetry, re-evaluation of patient's condition, obtaining history from patient or surrogate and review of old charts   (including critical care time)  Medications Ordered in ED Medications  sodium chloride flush (NS) 0.9 % injection 3 mL (3 mLs Intravenous Given 04/27/19 1401)  dexamethasone (DECADRON) injection 10 mg (10 mg Intravenous Given 04/27/19 1401)  metoCLOPramide (REGLAN) injection 10 mg (10 mg Intravenous Given 04/27/19 1400)  prochlorperazine (COMPAZINE) injection 5 mg (5 mg Intravenous Given 04/27/19 1401)  ketorolac (TORADOL) 15 MG/ML injection 15 mg (15 mg Intravenous Given 04/27/19 1400)  diphenhydrAMINE (BENADRYL) injection 50 mg (50 mg Intravenous Given 04/27/19 1408)     Initial Impression / Assessment and Plan / ED Course  I have reviewed the triage vital signs and the nursing notes.  Pertinent labs & imaging results that were available during my care of the patient were reviewed by me and considered in my medical decision making (see chart for details).       On my initial evaluation, patient initiated as code stroke.  Discussed with Dr. Leida Lauth.  DDX includes Bell's palsy and complex migraine.   He plans on migraine cocktail.  I have reevaluated patient after migraine cocktail while  she continues to have some headache and symptoms as noted in initial physical exam Discussed with Dr. Tyrone Nine and care assumed. Final Clinical Impressions(s) / ED Diagnoses   Final diagnoses:  Acute right-sided weakness    ED Discharge Orders    None       Pattricia Boss, MD 04/27/19 [id number]

## 2019-04-27 NOTE — ED Triage Notes (Addendum)
Pt arrives via EMS from home with 2 days of headache, tingling in right face. Woke up this morning noted right facial droop. Pt with clear speech, right facial droop, right arm weakness, slight drift. VSS. 20g RAC.

## 2019-04-27 NOTE — Discharge Instructions (Signed)
Your MRI did not show an acute stroke.  Please follow-up with your family doctor and neurologist.

## 2019-04-27 NOTE — ED Notes (Addendum)
EDP arrives at 1240, begins interviewing pt. Pt states to EDP that she did not notice heaviness in R side of face until today at 0900. EDP directs for Code Stroke to be called at this time.

## 2019-04-27 NOTE — ED Notes (Signed)
Patient transported to MRI 

## 2019-04-27 NOTE — ED Notes (Signed)
Administered Decadron, Compazine, Reglan, and Toradol as ordered for Migraine. This RN came back to assess pt, pt anxious and having an episode of pruritis.  This RN spoke with EDP, received an order for 50mg  of Benadryl.  Symptoms resolved quickly upon administration. Pt no longer in distress, resting comfortably.

## 2019-04-27 NOTE — ED Notes (Addendum)
CT called to open a bay for Code Stroke

## 2019-04-27 NOTE — ED Notes (Addendum)
Arrived to room and began interviewing pt about sx, explains HA and R sided facial droop and numbness for 2 days. Pt states "I also would like a CT of my neck. I wonder if all this pain in my head and in my neck is from a pinched nerve. It hurts to move my head back." Began performing NIH.

## 2019-04-27 NOTE — Code Documentation (Signed)
53yo female arriving to [facility] via [facility] at 1123. Patient reports right frontal headache x 2-3 days. Patient with history of daily migraines. This morning patient noticed that her face was heavy around 1000. She went and looked in the mirror and noted right facial droop. She presented to the ED where right sided weakness was assessed and a code stroke was activated. Stroke team to the bedside in CT. NIHSS 4, see documentation for details and code stroke times. Patient with right upper and lower facial weakness, right arm and leg drift and decreased sensation in the right arm on exam. Patient transported to the ED. No acute stroke treatment at this time. Code stroke canceled. Bedside handoff with ED RN [place].

## 2019-04-27 NOTE — ED Notes (Signed)
Pt ambulated to restroom with steady gait. Pt required no assistance.

## 2019-04-27 NOTE — Consult Note (Addendum)
Neurology Consultation  Reason for Consult: code stroke Referring Physician: Dr. Jeanell Sparrow   CC: Headache, right facial droop and right numbness and tingling  History is obtained from: medical record and patient   HPI: Jennifer Jarvis is a 53 y.o. female with medical history of migraines who presents to the [facility] ED for ongoing headache that started 2-3 days ago.  This morning she developed right facial droop around ~10 am with twitching of face with numbness and tingling associated with this. Code stroke was called.  Neurology met patient in the CT scan.  Patient states she does get migraines with twitching of her face, not usually associated with weakness or numbness and tingling Code stroke cancelled by neurology   LKW: ~10 tpa given?: no, not a stroke Premorbid modified Rankin scale (mRS):  0-Completely asymptomatic and back to baseline post-stroke   ROS: A 14 point ROS was performed and is negative except as noted in the HPI.   No past medical history on file.   Family History  Problem Relation Age of Onset  . Healthy Mother   . Healthy Father   . Healthy Sister   . Healthy Brother   . Healthy Sister      Social History:   reports that she has never smoked. She has never used smokeless tobacco. She reports that she does not drink alcohol or use drugs.   Medications  Current Facility-Administered Medications:  .  dexamethasone (DECADRON) injection 10 mg, 10 mg, Intravenous, Once, Rogers Blocker, Denise A, NP .  ketorolac (TORADOL) 15 MG/ML injection 15 mg, 15 mg, Intravenous, Once, Rogers Blocker, Denise A, NP .  metoCLOPramide (REGLAN) injection 10 mg, 10 mg, Intravenous, Once, Rogers Blocker, Denise A, NP .  prochlorperazine (COMPAZINE) injection 5 mg, 5 mg, Intravenous, Once, Rogers Blocker, Denise A, NP .  sodium chloride flush (NS) 0.9 % injection 3 mL, 3 mL, Intravenous, Once, Pattricia Boss, MD  Current Outpatient Medications:  .  acetaminophen (TYLENOL) 500 MG tablet, Take 1,000 mg by mouth every 6  (six) hours as needed for mild pain, moderate pain or headache., Disp: , Rfl:  .  docusate sodium (COLACE) 100 MG capsule, Take 200 mg by mouth 2 (two) times daily as needed for mild constipation or moderate constipation. , Disp: , Rfl:  .  ibuprofen (ADVIL,MOTRIN) 200 MG tablet, Take 400 mg by mouth every 6 (six) hours as needed for headache, mild pain or moderate pain., Disp: , Rfl:  .  triamcinolone (NASACORT) 55 MCG/ACT AERO nasal inhaler, Place 2 sprays into the nose daily as needed., Disp: , Rfl:  .  zolpidem (AMBIEN) 10 MG tablet, Take 5 mg by mouth at bedtime as needed for sleep. , Disp: , Rfl:    Exam: Current vital signs: BP 136/79   Pulse 92   Temp 97.9 F (36.6 C) (Oral)   Resp 17   Ht 5' 3"  (1.6 m)   Wt 99.8 kg   LMP 03/28/2019   SpO2 100%   BMI 38.97 kg/m  Vital signs in last 24 hours: Temp:  [97.9 F (36.6 C)] 97.9 F (36.6 C) (10/12 1323) Pulse Rate:  [81-92] 92 (10/12 1311) Resp:  [11-20] 17 (10/12 1311) BP: (120-136)/(75-84) 136/79 (10/12 1311) SpO2:  [100 %] 100 % (10/12 1311) Weight:  [99.8 kg] 99.8 kg (10/12 1126)  GENERAL: Awake, alert in NAD HENT: - Normocephalic and atraumatic, dry mm Eyes- No papilledema LUNGS - Clear to auscultation bilaterally with no wheezes CV - S1S2 RRR, no m/r/g, equal pulses  bilaterally. ABDOMEN - Soft, nontender, nondistended with normoactive BS Ext: warm, well perfused, intact peripheral pulses, no  edema Psych- normal affect and mood  NEURO:  Mental Status: AA&Ox3, aware of situation, place person and time Language: speech is clear.  Naming, repetition, fluency, and comprehension intact. Cranial Nerves: PERRL 42m/brisk. EOMI, visual fields full, right facial asymmetry, facial sensation intact, hearing intact, tongue/uvula/soft palate midline, normal sternocleidomastoid and trapezius muscle strength. No evidence of tongue atrophy or fibrillations Motor: moves all extremities, left side 5/5, right side 4/5 Tone: is normal  and bulk is normal Sensation- Right arm decreased sensation to light touch Coordination: FTN intact bilaterally, no ataxia in BLE Gait- deferred  NIHSS 1a Level of Conscious.: 0 1b LOC Questions: 0 1c LOC Commands: 0 2 Best Gaze: 0 3 Visual: 0 4 Facial Palsy: 1 5a Motor Arm - left: 0 5b Motor Arm - Right: 1 6a Motor Leg - Left: 0 6b Motor Leg - Right: 1 7 Limb Ataxia: 0 8 Sensory: 1 9 Best Language: 0 10 Dysarthria: 0 11 Extinct. and Inatten.: 0 TOTAL: 4   Labs I have reviewed labs in epic and the results pertinent to this consultation are:   CBC    Component Value Date/Time   WBC 4.8 04/27/2019 1137   RBC 4.55 04/27/2019 1137   HGB 13.1 04/27/2019 1137   HCT 39.2 04/27/2019 1137   PLT 346 04/27/2019 1137   MCV 86.2 04/27/2019 1137   MCH 28.8 04/27/2019 1137   MCHC 33.4 04/27/2019 1137   RDW 14.7 04/27/2019 1137   LYMPHSABS 2.0 04/27/2019 1137   MONOABS 0.3 04/27/2019 1137   EOSABS 0.1 04/27/2019 1137   BASOSABS 0.1 04/27/2019 1137    CMP     Component Value Date/Time   NA 137 04/27/2019 1137   K 3.5 04/27/2019 1137   CL 105 04/27/2019 1137   CO2 21 (L) 04/27/2019 1137   GLUCOSE 140 (H) 04/27/2019 1137   BUN 9 04/27/2019 1137   CREATININE 0.82 04/27/2019 1137   CALCIUM 9.1 04/27/2019 1137   PROT 7.4 04/27/2019 1137   ALBUMIN 3.7 04/27/2019 1137   AST 20 04/27/2019 1137   ALT 15 04/27/2019 1137   ALKPHOS 53 04/27/2019 1137   BILITOT 0.5 04/27/2019 1137   GFRNONAA >60 04/27/2019 1137   GFRAA >60 04/27/2019 1137    Lipid Panel  No results found for: CHOL, TRIG, HDL, CHOLHDL, VLDL, LDLCALC, LDLDIRECT   Imaging I have reviewed the images obtained:  CT-scan of the brain  04/27/2019 no acute abnormality  Assessment:  Jennifer HAYFORD WCHANDONNETis a 53y.o. female with medical history of migraines [facility]-3 [facility].  This morning she developed right facial droop around ~10 am with twitching of face  with numbness and tingling associated with this. Code stroke was called.  Neurology met patient in the CT scan.  Patient states she does get migraines with twitching of her face, not usually associated with weakness or numbness and tingling Code stroke cancelled by neurology   Impression: Complex Migraine vs Bells palsy  Recommendations: -Migraine cocktail- decadron, reglan, compazine and troadol x 1 -consider MRI head if symptoms persist -r/o infection -Neurology will follow    DDalbert Mayotte NP    NEUROHOSPITALIST ADDENDUM Performed a face to face diagnostic evaluation.   I have reviewed the contents of history and physical exam as documented by PA/ARNP/Resident and agree with above documentation.  I have discussed  and formulated the above plan as documented. Edits to the note have been made as needed.   Patient with history of migraines presents with 3-day history of headache and noted to have facial droop (appears lower motor neuron ) with numbness of the face, right arm and leg and mild right-sided weakness.  Suspect complex migraine given absence of vascular risk factors however recommend MRI brain to rule out stroke.  If MRI brain shows stroke recommend admission for stroke work-up   Jennifer Trickett MD [facility] [phone]   If 7pm to 7am, please call on call as listed on AMION.

## 2019-04-27 NOTE — ED Notes (Signed)
Pt returned to room from MRI.

## 2019-04-27 NOTE — ED Provider Notes (Signed)
I received the patient in signout from Dr. Jeanell Sparrow.  Patient is a 53 year old female with a chief complaints of acute onset strokelike symptoms.  One-sided facial droop and right upper extremity weakness.  Seen by neurology and felt to be unlikely a stroke.  Initially her recommended headache cocktail and discharged home but with persistent symptoms and MRI was ordered.  Plan is to await MRI results.  MRI is negative.  Will discharge home.   Jennifer Etienne, DO 04/27/19 2017

## 2019-04-30 ENCOUNTER — Ambulatory Visit: Payer: BC Managed Care – PPO | Admitting: Neurology

## 2019-04-30 ENCOUNTER — Encounter: Payer: Self-pay | Admitting: Neurology

## 2019-04-30 ENCOUNTER — Other Ambulatory Visit: Payer: Self-pay

## 2019-04-30 DIAGNOSIS — G510 Bell's palsy: Secondary | ICD-10-CM | POA: Diagnosis not present

## 2019-04-30 DIAGNOSIS — G43019 Migraine without aura, intractable, without status migrainosus: Secondary | ICD-10-CM

## 2019-04-30 HISTORY — DX: Migraine without aura, intractable, without status migrainosus: G43.019

## 2019-04-30 HISTORY — DX: Bell's palsy: G51.0

## 2019-04-30 MED ORDER — PREDNISONE 5 MG PO TABS: ORAL_TABLET | ORAL | 0 refills | Status: DC

## 2019-04-30 MED ORDER — VALACYCLOVIR HCL 500 MG PO TABS: 500.0000 mg | ORAL_TABLET | Freq: Two times a day (BID) | ORAL | 0 refills | Status: DC

## 2019-04-30 MED ORDER — ARTIFICIAL TEARS OPHTHALMIC OINT: TOPICAL_OINTMENT | Freq: Every day | OPHTHALMIC | 1 refills | Status: AC

## 2019-04-30 NOTE — Progress Notes (Signed)
Reason for visit: Right facial weakness  Referring physician: [place]  Hannah Jennifer Jarvis is a 53 y.o. female  History of present illness:  [name] is a 53 year old right-handed black female with a history onset of some facial twitching on the right that began around 25 April 2019.  By 12 October she began noting some weakness on the right side of her face.  The patient apparently also complained of right facial numbness and some numbness and weakness of the right arm.  She went to the hospital for an evaluation, MRI of the brain was unremarkable.  The patient has a history of migraine headaches that usually occur 2 times a week, over the last 2 weeks, they have become daily in nature going from the front to the back of the head.  The patient reports no ear pain and no significant alteration in hearing but she does have some photophobia and phonophobia with the headache.  The patient has noted some inability to close the right eye, and she notes that when she eats or drinks anything it will trickle out of the right side of her face.  She reports no double vision or loss of vision.  She has no difficulty with walking, no change in balance, and she reports no change in control of the bowels or the bladder.  She does have some chronic constipation issues.  She is sent to this office for an evaluation.  Past Medical History:  Diagnosis Date  . History of migraine headaches     Past Surgical History:  Procedure Laterality Date  . CESAREAN SECTION      Family History  Problem Relation Age of Onset  . Healthy Mother   . Healthy Father   . Healthy Sister   . Healthy Brother   . Healthy Sister     Social history:  reports that she has never smoked. She has never used smokeless tobacco. She reports that she does not drink alcohol or use drugs.  Medications:  Prior to Admission medications   Medication Sig Start Date End Date Taking? Authorizing Provider  acetaminophen (TYLENOL)  500 MG tablet Take 1,000 mg by mouth every 6 (six) hours as needed for mild pain, moderate pain or headache.   Yes [provider]  docusate sodium (COLACE) 100 MG capsule Take 200 mg by mouth 2 (two) times daily as needed for mild constipation or moderate constipation.    Yes [provider]  ibuprofen (ADVIL,MOTRIN) 200 MG tablet Take 400 mg by mouth every 6 (six) hours as needed for headache, mild pain or moderate pain.   Yes [provider]  triamcinolone (NASACORT) 55 MCG/ACT AERO nasal inhaler Place 2 sprays into the nose daily as needed.   Yes [provider]      Allergies  Allergen Reactions  . Latex Rash  . Eggs Or Egg-Derived Products Hives    ROS:  Out of a complete 14 system review of symptoms, the patient complains only of the following symptoms, and all other reviewed systems are negative.  Right facial weakness Numbness  Blood pressure 128/86, pulse 81, temperature 97.7 F (36.5 C), temperature source Temporal, weight 210 lb (95.3 kg).  Physical Exam  General: The patient is alert and cooperative at the time of the examination.  The patient is moderately to markedly obese.  Eyes: Pupils are equal, round, and reactive to light. Discs are flat bilaterally.  Ears: Tympanic membranes are clear bilaterally.  Neck: The neck  is supple, no carotid bruits are noted.  Respiratory: The respiratory examination is clear.  Cardiovascular: The cardiovascular examination reveals a regular rate and rhythm, no obvious murmurs or rubs are noted.  Skin: Extremities are without significant edema.  Neurologic Exam  Mental status: The patient is alert and oriented x 3 at the time of the examination. The patient has apparent normal recent and remote memory, with an apparently normal attention span and concentration ability.  Cranial nerves: Facial symmetry is not present.  The patient has clear facial droop on the right.  The patient has  significant peripheral distribution facial weakness on the right.  With eye closure, the patient does not blink on the right.  There is good sensation of the face to pinprick and soft touch bilaterally. The strength of  the muscles to head turning and shoulder shrug are normal bilaterally. Speech is well enunciated, no aphasia or dysarthria is noted. Extraocular movements are full. Visual fields are full. The tongue is midline, and the patient has symmetric elevation of the soft palate. No obvious hearing deficits are noted.  Motor: The motor testing reveals 5 over 5 strength of all 4 extremities. Good symmetric motor tone is noted throughout.  Sensory: Sensory testing is intact to pinprick, soft touch, vibration sensation, and position sense on all 4 extremities, with exception of some decreased pinprick sensation on the right arm as compared to the left. No evidence of extinction is noted.  Coordination: Cerebellar testing reveals good finger-nose-finger and heel-to-shin bilaterally.  Gait and station: Gait is normal. Tandem gait is normal. Romberg is negative. No drift is seen.  Reflexes: Deep tendon reflexes are symmetric and normal bilaterally. Toes are downgoing bilaterally.   MRI brain 04/27/19:  IMPRESSION: Normal for age non-contrast MRI appearance of the brain. The patient declined IV contrast.  * MRI scan images were reviewed online. I agree with the written report.    Assessment/Plan:  1.  Right Bell's palsy   2.  Migraine headache, intractable  The patient has had onset of a Bell's palsy on the right.  The patient was placed on a short course of prednisone and Valtrex, she will use Lacri-Lube in the right eye in the evening.  She will follow-up here in 3 months.  She will use artificial tears in the right eye as much as needed throughout the day.   Jill Alexanders MD 04/30/2019 3:01 PM  [facility] [address] [place], [place]  [address]  Phone [phone] Fax [phone]

## 2019-04-30 NOTE — Patient Instructions (Addendum)
Use artifical tears if needed during the day, and use lacrilube ointment at night.  We will go on a short course of prednisone and Valtrex. Bell Palsy, Adult  Bell palsy is a short-term inability to move muscles in part of the face. The inability to move (paralysis) results from inflammation or compression of the facial nerve, which travels along the skull and under the ear to the side of the face (7th cranial nerve). This nerve is responsible for facial movements that include blinking, closing the eyes, smiling, and frowning. What are the causes? The exact cause of this condition is not known. It may be caused by an infection from a virus, such as the chickenpox (herpes zoster), Epstein-Barr, or mumps virus. What increases the risk? You are more likely to develop this condition if:  You are pregnant.  You have diabetes.  You have had a recent infection in your nose, throat, or airways (upper respiratory infection).  You have a weakened body defense system (immune system).  You have had a facial injury, such as a fracture.  You have a family history of Bell palsy. What are the signs or symptoms? Symptoms of this condition include:  Weakness on one side of the face.  Drooping eyelid and corner of the mouth.  Excessive tearing in one eye.  Difficulty closing the eyelid.  Dry eye.  Drooling.  Dry mouth.  Changes in taste.  Change in facial appearance.  Pain behind one ear.  Ringing in one or both ears.  Sensitivity to sound in one ear.  Facial twitching.  Headache.  Impaired speech.  Dizziness.  Difficulty eating or drinking. Most of the time, only one side of the face is affected. Rarely, Bell palsy affects the whole face. How is this diagnosed? This condition is diagnosed based on:  Your symptoms.  Your medical history.  A physical exam. You may also have to see health care providers who specialize in disorders of the nerves (neurologist) or diseases  and conditions of the eye (ophthalmologist). You may have tests, such as:  A test to check for nerve damage (electromyogram).  Imaging studies, such as CT or MRI scans.  Blood tests. How is this treated? This condition affects every person differently. Sometimes symptoms go away without treatment within a couple weeks. If treatment is needed, it varies from person to person. The goal of treatment is to reduce inflammation and protect the eye from damage. Treatment for Bell palsy may include:  Medicines, such as: ? Steroids to reduce swelling and inflammation. ? Antiviral drugs. ? Pain relievers, including aspirin, acetaminophen, or ibuprofen.  Eye drops or ointment to keep your eye moist.  Eye protection, if you cannot close your eye.  Exercises or massage to regain muscle strength and function (physical therapy). Follow these instructions at home:   Take over-the-counter and prescription medicines only as told by your health care provider.  If your eye is affected: ? Keep your eye moist with eye drops or ointment as told by your health care provider. ? Follow instructions for eye care and protection as told by your health care provider.  Do any physical therapy exercises as told by your health care provider.  Keep all follow-up visits as told by your health care provider. This is important. Contact a health care provider if:  You have a fever.  Your symptoms do not get better within 2-3 weeks, or your symptoms get worse.  Your eye is red, irritated, or painful.  You have  new symptoms. Get help right away if:  You have weakness or numbness in a part of your body other than your face.  You have trouble swallowing.  You develop neck pain or stiffness.  You develop dizziness or shortness of breath. Summary  Bell palsy is a short-term inability to move muscles in part of the face. The inability to move (paralysis) results from inflammation or compression of the facial  nerve.  This condition affects every person differently. Sometimes symptoms go away without treatment within a couple weeks.  If treatment is needed, it varies from person to person. The goal of treatment is to reduce inflammation and protect the eye from damage.  Contact your health care provider if your symptoms do not get better within 2-3 weeks, or your symptoms get worse. This information is not intended to replace advice given to you by your health care provider. Make sure you discuss any questions you have with your health care provider. Document Released: 07/02/2005 Document Revised: 06/14/2017 Document Reviewed: 09/04/2016 Elsevier Patient Education  2020 [employer].

## 2019-05-04 ENCOUNTER — Telehealth: Payer: Self-pay

## 2019-05-04 LAB — HM PAP SMEAR

## 2019-05-04 LAB — RESULTS CONSOLE HPV: CHL HPV: POSITIVE

## 2019-05-04 NOTE — Telephone Encounter (Addendum)
I reached out to the pt's husband. He wanted to see if Dr. Jannifer Franklin would be willing to complete his FMLA paper work since he has been out of work with his wife from 10/12- 10/21 due to stroke work up. I advised pt to drop forms off and we would review and process appropriately. Pt was advised of the 50$ charge for [facility] paper work.

## 2019-05-04 NOTE — Telephone Encounter (Signed)
Patient's husband called and would like to know if Dr. Jannifer Franklin would fill out his FMLA paperwork. Please advise.

## 2019-05-06 DIAGNOSIS — Z0289 Encounter for other administrative examinations: Secondary | ICD-10-CM

## 2019-05-06 NOTE — Telephone Encounter (Signed)
Dr. Jannifer Franklin has completed the FMLA paper work for the pt's husband. I have fwd forms back to medical records for processing.

## 2019-05-18 DIAGNOSIS — R87618 Other abnormal cytological findings on specimens from cervix uteri: Secondary | ICD-10-CM | POA: Insufficient documentation

## 2019-06-08 ENCOUNTER — Telehealth: Payer: Self-pay | Admitting: Neurology

## 2019-06-08 NOTE — Telephone Encounter (Signed)
I called patient and spoke with her regarding her FMLA paperwork from [employer]. Patient states that she is working part-time right now and this paperwork can be disregarded at this time. I advised patient to call back if anything is to change.

## 2019-06-22 ENCOUNTER — Telehealth: Payer: Self-pay | Admitting: Neurology

## 2019-06-22 NOTE — Telephone Encounter (Signed)
Patient called stating she hears ringing in her ear and would like to speak to someone about what is going on. Patient is unsure if a referral to ENT is needed  Please follow up

## 2019-06-22 NOTE — Telephone Encounter (Signed)
I reached out to the pt and left vm asking her to call me back.

## 2019-06-23 NOTE — Telephone Encounter (Signed)
Lvm second vm asking pt to call back to further discusses.

## 2019-06-23 NOTE — Telephone Encounter (Signed)
I called the patient.  I do not think that the Bell's palsy and the ringing in the ears has any association with one another.  The patient has undergone MRI of the brain that was normal.  If the tinnitus is significant and persistent, the next evaluation should come through an ENT physician and an audiologist.

## 2019-06-23 NOTE — Telephone Encounter (Signed)
Pt returned my call. She reports on going right ear tinnitus for the past 2 weeks. She reports at times the pitch of the ringing will increase. She states she has not seen an ENT in the past and is wondering if she should look into this? She has tried otc ear drops but has not noticed much benefit. She does not notice any triggers that make the ringing worse. She thinks this could be related to her bells palsy wanted to know if MD thought it could be related too?

## 2019-06-29 DIAGNOSIS — H93A1 Pulsatile tinnitus, right ear: Secondary | ICD-10-CM | POA: Insufficient documentation

## 2019-08-06 ENCOUNTER — Ambulatory Visit: Payer: BC Managed Care – PPO | Admitting: Neurology

## 2019-09-04 DIAGNOSIS — N939 Abnormal uterine and vaginal bleeding, unspecified: Secondary | ICD-10-CM | POA: Diagnosis not present

## 2019-09-22 DIAGNOSIS — Z0000 Encounter for general adult medical examination without abnormal findings: Secondary | ICD-10-CM | POA: Diagnosis not present

## 2019-09-22 DIAGNOSIS — G894 Chronic pain syndrome: Secondary | ICD-10-CM | POA: Diagnosis not present

## 2019-09-22 DIAGNOSIS — R7303 Prediabetes: Secondary | ICD-10-CM | POA: Diagnosis not present

## 2019-09-22 DIAGNOSIS — M546 Pain in thoracic spine: Secondary | ICD-10-CM | POA: Diagnosis not present

## 2019-09-22 DIAGNOSIS — H93A1 Pulsatile tinnitus, right ear: Secondary | ICD-10-CM | POA: Diagnosis not present

## 2019-11-04 DIAGNOSIS — Z01812 Encounter for preprocedural laboratory examination: Secondary | ICD-10-CM | POA: Diagnosis not present

## 2019-11-04 DIAGNOSIS — Z20822 Contact with and (suspected) exposure to covid-19: Secondary | ICD-10-CM | POA: Diagnosis not present

## 2019-11-06 DIAGNOSIS — N840 Polyp of corpus uteri: Secondary | ICD-10-CM | POA: Diagnosis not present

## 2019-11-06 DIAGNOSIS — E669 Obesity, unspecified: Secondary | ICD-10-CM | POA: Diagnosis not present

## 2019-11-06 DIAGNOSIS — D259 Leiomyoma of uterus, unspecified: Secondary | ICD-10-CM | POA: Diagnosis not present

## 2019-11-06 DIAGNOSIS — N939 Abnormal uterine and vaginal bleeding, unspecified: Secondary | ICD-10-CM | POA: Diagnosis not present

## 2019-11-06 DIAGNOSIS — D252 Subserosal leiomyoma of uterus: Secondary | ICD-10-CM | POA: Diagnosis not present

## 2019-11-06 DIAGNOSIS — D251 Intramural leiomyoma of uterus: Secondary | ICD-10-CM | POA: Diagnosis not present

## 2019-11-06 DIAGNOSIS — N800 Endometriosis of uterus: Secondary | ICD-10-CM | POA: Diagnosis not present

## 2019-11-06 DIAGNOSIS — N852 Hypertrophy of uterus: Secondary | ICD-10-CM | POA: Diagnosis not present

## 2021-07-20 ENCOUNTER — Ambulatory Visit (INDEPENDENT_AMBULATORY_CARE_PROVIDER_SITE_OTHER): Payer: BC Managed Care – PPO | Admitting: Nurse Practitioner

## 2021-07-20 ENCOUNTER — Encounter: Payer: Self-pay | Admitting: Nurse Practitioner

## 2021-07-20 ENCOUNTER — Other Ambulatory Visit: Payer: Self-pay

## 2021-07-20 VITALS — BP 110/70 | HR 88 | Temp 96.9°F | Ht 63.0 in

## 2021-07-20 DIAGNOSIS — Z0001 Encounter for general adult medical examination with abnormal findings: Secondary | ICD-10-CM

## 2021-07-20 DIAGNOSIS — Z13220 Encounter for screening for lipoid disorders: Secondary | ICD-10-CM

## 2021-07-20 DIAGNOSIS — R739 Hyperglycemia, unspecified: Secondary | ICD-10-CM

## 2021-07-20 DIAGNOSIS — M25511 Pain in right shoulder: Secondary | ICD-10-CM

## 2021-07-20 DIAGNOSIS — G8929 Other chronic pain: Secondary | ICD-10-CM

## 2021-07-20 DIAGNOSIS — Z136 Encounter for screening for cardiovascular disorders: Secondary | ICD-10-CM

## 2021-07-20 NOTE — Progress Notes (Signed)
Subjective:    Patient ID: Jennifer Jarvis, female    DOB: 1965/09/09, 56 y.o.   MRN: [id number]  Patient presents today for CPE   HPI Colon cancer screen completed per patient by Dr. Mickeal Needy colonoscopy at 45(benign polyps), 2nd done at 24 (normal). Report requested  Vision:will schedule Dental:will schedule Diet:regular Exercise:none Weight:  Wt Readings from Last 3 Encounters:  04/30/19 210 lb (95.3 kg)  04/27/19 220 lb (99.8 kg)  04/17/16 200 lb (90.7 kg)    Sexual History (orientation,birth control, marital status, STD):pelvic and breast exam completed by GYN: Dr. Tamela Oddi, reviewed results via careeverywhere  Depression/Suicide: Depression screen [facility] 2/9 07/20/2021  Decreased Interest 0  Down, Depressed, Hopeless 0  PHQ - 2 Score 0   Immunizations: (TDAP, Hep C screen, Pneumovax, Influenza, zoster)  Health Maintenance  Topic Date Due   COVID-19 Vaccine (1) Never done   HIV Screening  Never done   Hepatitis C Screening: USPSTF Recommendation to screen - Ages 27-79 yo.  Never done   Pap Smear  Never done   Colon Cancer Screening  Never done   Mammogram  04/29/2016   Flu Shot  10/13/2021*   Zoster (Shingles) Vaccine (1 of 2) 10/18/2021*   Tetanus Vaccine  07/20/2022*   Pneumococcal Vaccination  Aged Out   HPV Vaccine  Aged Out  *Topic was postponed. The date shown is not the original due date.   Advanced Directive: Advanced Directives 04/27/2019  Does Patient Have a Medical Advance Directive? Yes  Type of Advance Directive Living will  Would patient like information on creating a medical advance directive? -    Medications and allergies reviewed with patient and updated if appropriate.  Patient Active Problem List   Diagnosis Date Noted   Hyperglycemia 07/20/2021   Pulsatile tinnitus of right ear 06/29/2019   Pap smear abnormality of cervix/human papillomavirus (HPV) positive 05/18/2019   Right-sided Bell's palsy 04/30/2019   Common migraine  with intractable migraine 04/30/2019   Chronic cervical pain 02/13/2019   Constipation 02/13/2019   Chronic pain syndrome 04/17/2016   Numbness and tingling 02/06/2016   Allergic rhinitis 07/23/2011   Insomnia 07/23/2011   Current Outpatient Medications on File Prior to Visit  Medication Sig Dispense Refill   artificial tears (LACRILUBE) OINT ophthalmic ointment Place into the right eye at bedtime. 1 Tube 1   No current facility-administered medications on file prior to visit.   Past Medical History:  Diagnosis Date   Common migraine with intractable migraine 04/30/2019   History of migraine headaches    Right-sided Bell's palsy 04/30/2019    Past Surgical History:  Procedure Laterality Date   CESAREAN SECTION     Social History   Socioeconomic History   Marital status: Married    Spouse name: Not on file   Number of children: 1   Years of education: 16   Highest education level: Not on file  Occupational History   Occupation: [employer]  Tobacco Use   Smoking status: Never   Smokeless tobacco: Never  Vaping Use   Vaping Use: Never used  Substance and Sexual Activity   Alcohol use: No   Drug use: No   Sexual activity: Not on file  Other Topics Concern   Not on file  Social History Narrative   Lives at home w/ her husband and child   Right-handed   Caffeine: 1-2 cups per day   Social Determinants of Health   Financial Resource Strain: Not on [employer]  Insecurity: Not on file  Transportation Needs: Not on file  Physical Activity: Not on file  Stress: Not on file  Social Connections: Not on file   Family History  Problem Relation Age of Onset   Healthy Mother    Healthy Father    Healthy Sister    Healthy Brother    Healthy Sister        Review of Systems  Constitutional:  Negative for fever, malaise/fatigue and weight loss.  HENT:  Negative for congestion and sore throat.   Eyes:        Negative for visual changes  Respiratory:  Negative for cough  and shortness of breath.   Cardiovascular:  Negative for chest pain, palpitations and leg swelling.  Gastrointestinal:  Negative for blood in stool, constipation, diarrhea and heartburn.  Genitourinary:  Negative for dysuria, frequency and urgency.  Musculoskeletal:  Negative for falls, joint pain and myalgias.  Skin:  Negative for rash.  Neurological:  Negative for dizziness, sensory change and headaches.  Endo/Heme/Allergies:  Does not bruise/bleed easily.  Psychiatric/Behavioral:  Negative for depression, substance abuse and suicidal ideas. The patient is not nervous/anxious.    Objective:   Vitals:   07/20/21 1042  BP: 110/70  Pulse: 88  Temp: (!) 96.9 F (36.1 C)  SpO2: 96%    Body mass index is 37.2 kg/m.   Physical Examination:  Physical Exam Vitals reviewed.  Constitutional:      General: She is not in acute distress.    Appearance: She is well-developed.  HENT:     Right Ear: Tympanic membrane, ear canal and external ear normal.     Left Ear: Tympanic membrane, ear canal and external ear normal.  Eyes:     Extraocular Movements: Extraocular movements intact.     Conjunctiva/sclera: Conjunctivae normal.  Cardiovascular:     Rate and Rhythm: Normal rate and regular rhythm.     Pulses: Normal pulses.     Heart sounds: Normal heart sounds.  Pulmonary:     Effort: Pulmonary effort is normal. No respiratory distress.     Breath sounds: Normal breath sounds.  Chest:     Chest wall: No tenderness.  Abdominal:     General: Bowel sounds are normal.     Palpations: Abdomen is soft.  Musculoskeletal:        General: Normal range of motion.  Skin:    General: Skin is warm and dry.  Neurological:     Mental Status: She is alert and oriented to person, place, and time.     Deep Tendon Reflexes: Reflexes are normal and symmetric.  Psychiatric:        Mood and Affect: Mood normal.        Behavior: Behavior normal.        Thought Content: Thought content normal.     ASSESSMENT and PLAN: This visit occurred during the SARS-CoV-2 public health emergency.  Safety protocols were in place, including screening questions prior to the visit, additional usage of staff PPE, and extensive cleaning of exam room while observing appropriate contact time as indicated for disinfecting solutions.   Ronni was seen today for establish care.  Diagnoses and all orders for this visit:  Encounter for preventative adult health care exam with abnormal findings -     Lipid panel; Future -     Comprehensive metabolic panel; Future -     CBC; Future  Hyperglycemia -     Hemoglobin A1c; Future  Encounter for lipid  screening for cardiovascular disease -     Lipid panel; Future  Chronic right shoulder pain  Sign medical release form to get your records from Dr. Kathrynn RunningManning Start heart healthy diet and regular exercise. Start shoulder exercise to improve strength and ROM. Schedule appt if shoulder pain and paresthesia worsens.    Problem List Items Addressed This Visit       Other   Chronic cervical pain   Hyperglycemia   Relevant Orders   Hemoglobin A1c   Other Visit Diagnoses     Encounter for preventative adult health care exam with abnormal findings    -  Primary   Relevant Orders   Lipid panel   Comprehensive metabolic panel   CBC   Encounter for lipid screening for cardiovascular disease       Relevant Orders   Lipid panel       Follow up: Return in about 1 year (around 07/20/2022) for CPE (fasting).  Alysia Pennaharlotte Evolette Pendell, NP

## 2021-07-20 NOTE — Patient Instructions (Addendum)
Thank you for choosing [facility] Schedule lab appt for fasting blood draw. Need to be fasting 8hrs prior to blood draw. Ok to drink water. Sign medical release form to get your records from Dr. Tammi Klippel Start heart healthy diet and regular exercise. Start shoulder exercise to improve strength and ROM. Schedule appt if shoulder pain and paresthesia worsens.   Shoulder Impingement Syndrome Rehab Ask your health care provider which exercises are safe for you. Do exercises exactly as told by your health care provider and adjust them as directed. It is normal to feel mild stretching, pulling, tightness, or discomfort as you do these exercises. Stop right away if you feel sudden pain or your pain gets worse. Do not begin these exercises until told by your health care provider. Stretching and range-of-motion exercise This exercise warms up your muscles and joints and improves the movement and flexibility of your shoulder. This exercise also helps to relieve pain and stiffness. Passive horizontal adduction In passive adduction, you use your other hand to move the injured arm toward your body. The injured arm does not move on its own. In this movement, your arm is moved across your body in the horizontal plane (horizontal adduction). Sit or stand and pull your left / right elbow across your chest, toward your other shoulder. Stop when you feel a gentle stretch in the back of your shoulder and upper arm. Keep your arm at shoulder height. Keep your arm as close to your body as you comfortably can. Hold for __________ seconds. Slowly return to the starting position. Repeat __________ times. Complete this exercise __________ times a day. Strengthening exercises These exercises build strength and endurance in your shoulder. Endurance is the ability to use your muscles for a long time, even after they get tired. External rotation, isometric This is an exercise in which you press the back of your  wrist against a door frame without moving your shoulder joint (isometric). Stand or sit in a doorway, facing the door frame. Bend your left / right elbow and place the back of your wrist against the door frame. Only the back of your wrist should be touching the frame. Keep your upper arm at your side. Gently press your wrist against the door frame, as if you are trying to push your arm away from your abdomen (external rotation). Press as hard as you are able without pain. Avoid shrugging your shoulder while you press your wrist against the door frame. Keep your shoulder blade tucked down toward the middle of your back. Hold for __________ seconds. Slowly release the tension, and relax your muscles completely before you repeat the exercise. Repeat __________ times. Complete this exercise __________ times a day. Internal rotation, isometric This is an exercise in which you press your palm against a door frame without moving your shoulder joint (isometric). Stand or sit in a doorway, facing the door frame. Bend your left / right elbow and place the palm of your hand against the door frame. Only your palm should be touching the frame. Keep your upper arm at your side. Gently press your hand against the door frame, as if you are trying to push your arm toward your abdomen (internal rotation). Press as hard as you are able without pain. Avoid shrugging your shoulder while you press your hand against the door frame. Keep your shoulder blade tucked down toward the middle of your back. Hold for __________ seconds. Slowly release the tension, and relax your muscles completely before you  repeat the exercise. Repeat __________ times. Complete this exercise __________ times a day. Scapular protraction, supine  Lie on your back on a firm surface (supine position). Hold a __________ weight in your left / right hand. Raise your left / right arm straight into the air so your hand is directly above your shoulder  joint. Push the weight into the air so your shoulder (scapula) lifts off the surface that you are lying on. The scapula will push up or forward (protraction). Do not move your head, neck, or back. Hold for __________ seconds. Slowly return to the starting position. Let your muscles relax completely before you repeat this exercise. Repeat __________ times. Complete this exercise __________ times a day. Scapular retraction  Sit in a stable chair without armrests, or stand up. Secure an exercise band to a stable object in front of you so the band is at shoulder height. Hold one end of the exercise band in each hand. Your palms should face down. Squeeze your shoulder blades together (retraction) and move your elbows slightly behind you. Do not shrug your shoulders upward while you do this. Hold for __________ seconds. Slowly return to the starting position. Repeat __________ times. Complete this exercise __________ times a day. Shoulder extension  Sit in a stable chair without armrests, or stand up. Secure an exercise band to a stable object in front of you so the band is above shoulder height. Hold one end of the exercise band in each hand. Straighten your elbows and lift your hands up to shoulder height. Squeeze your shoulder blades together and pull your hands down to the sides of your thighs (extension). Stop when your hands are straight down by your sides. Do not let your hands go behind your body. Hold for __________ seconds. Slowly return to the starting position. Repeat __________ times. Complete this exercise __________ times a day. This information is not intended to replace advice given to you by your health care provider. Make sure you discuss any questions you have with your health care provider. Document Revised: 10/24/2018 Document Reviewed: 07/28/2018 Elsevier Patient Education  [place].

## 2021-07-23 ENCOUNTER — Encounter: Payer: Self-pay | Admitting: Nurse Practitioner

## 2021-08-03 ENCOUNTER — Other Ambulatory Visit: Payer: BC Managed Care – PPO

## 2021-08-03 ENCOUNTER — Other Ambulatory Visit: Payer: Self-pay

## 2023-02-22 ENCOUNTER — Telehealth: Payer: Self-pay | Admitting: Nurse Practitioner

## 2023-02-22 NOTE — Telephone Encounter (Signed)
Patient was last seen 01.2023- left voicemail to schedule annual physical

## 2023-04-19 ENCOUNTER — Other Ambulatory Visit: Payer: Self-pay | Admitting: Nurse Practitioner

## 2023-04-19 DIAGNOSIS — Z1211 Encounter for screening for malignant neoplasm of colon: Secondary | ICD-10-CM

## 2023-04-19 DIAGNOSIS — Z1212 Encounter for screening for malignant neoplasm of rectum: Secondary | ICD-10-CM
